# Patient Record
Sex: Female | Born: 1995 | Race: White | Hispanic: Yes | Marital: Single | Smoking: Never smoker
Health system: Southern US, Community
[De-identification: ages and names within clinical notes are randomized; demographics above are authoritative.]

## PROBLEM LIST (undated history)

## (undated) DIAGNOSIS — J45909 Unspecified asthma, uncomplicated: Secondary | ICD-10-CM

## (undated) DIAGNOSIS — R569 Unspecified convulsions: Secondary | ICD-10-CM

## (undated) DIAGNOSIS — F419 Anxiety disorder, unspecified: Secondary | ICD-10-CM

## (undated) DIAGNOSIS — D649 Anemia, unspecified: Secondary | ICD-10-CM

---
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
## 2013-04-18 ENCOUNTER — Encounter (HOSPITAL_BASED_OUTPATIENT_CLINIC_OR_DEPARTMENT_OTHER): Payer: Self-pay | Admitting: *Deleted

## 2013-04-18 ENCOUNTER — Emergency Department (HOSPITAL_BASED_OUTPATIENT_CLINIC_OR_DEPARTMENT_OTHER)
Admission: EM | Admit: 2013-04-18 | Discharge: 2013-04-18 | Disposition: A | Discharge status: Home / Self Care | Payer: Medicaid Other | Attending: Emergency Medicine | Admitting: Emergency Medicine

## 2013-04-18 DIAGNOSIS — Z8669 Personal history of other diseases of the nervous system and sense organs: Secondary | ICD-10-CM | POA: Insufficient documentation

## 2013-04-18 DIAGNOSIS — Z79899 Other long term (current) drug therapy: Secondary | ICD-10-CM | POA: Insufficient documentation

## 2013-04-18 DIAGNOSIS — L0291 Cutaneous abscess, unspecified: Secondary | ICD-10-CM

## 2013-04-18 DIAGNOSIS — L0501 Pilonidal cyst with abscess: Secondary | ICD-10-CM | POA: Insufficient documentation

## 2013-04-18 MED ORDER — OXYCODONE-ACETAMINOPHEN 5-325 MG PO TABS
2.0000 | ORAL_TABLET | Freq: Once | ORAL | Status: AC
  Administered 2013-04-18: 2 via ORAL
  Filled 2013-04-18 (×2): qty 2

## 2013-04-18 MED ORDER — CEPHALEXIN 500 MG PO CAPS: 500.0000 mg | ORAL_CAPSULE | Freq: Four times a day (QID) | ORAL | Status: DC

## 2013-04-18 MED ORDER — ONDANSETRON 8 MG PO TBDP
8.0000 mg | ORAL_TABLET | Freq: Once | ORAL | Status: AC
  Administered 2013-04-18: 8 mg via ORAL
  Filled 2013-04-18: qty 1

## 2013-04-18 NOTE — ED Notes (Signed)
Pt c/o abscess to buttocks x 2 weeks seen by PMD x 2 days placed on septra, pt states the abscess is worse

## 2013-04-18 NOTE — ED Provider Notes (Signed)
History    CSN: [id number] Arrival date & time 04/18/13  1348  First MD Initiated Contact with Patient 04/18/13 1358     Chief Complaint  Patient presents with  . Abscess   (Consider location/radiation/quality/duration/timing/severity/associated sxs/prior Treatment) HPI Comments: Patient is an 17 year old female presents today with 6 days of worsening pilonidal abscess. She seen 3 days earlier by her primary care physician who prescribed her Bactrim. At this time she was told it was just a skin infection. She has tried Aleve for the pain which has not helped the pain as a pressure and sharp pain. It is worse with palpation and sitting. She feels the pain while she walks. She has had one previous abscess that resolved spontaneously. She states she feels well otherwise. No fevers, chills, nausea, vomiting, abdominal pain, paresthesias.  Patient is a 17 y.o. female presenting with abscess. The history is provided by the patient and a parent. No language interpreter was used.  Abscess Location:  Ano-genital Ano-genital abscess location:  Gluteal cleft Abscess quality: fluctuance and painful   Abscess quality: not draining and no redness   Red streaking: no   Duration:  6 days Progression:  Worsening Pain details:    Quality:  Pressure   Severity:  Severe   Duration:  6 days   Timing:  Intermittent (worse with palpation and sitting)   Progression:  Worsening Chronicity:  New Context: not diabetes, not immunosuppression, not injected drug use, not insect bite/sting and not skin injury   Relieved by:  Nothing Exacerbated by: palpation, sitting. Ineffective treatments:  NSAIDs, oral antibiotics and warm water soaks Associated symptoms: no anorexia, no fatigue, no fever, no headaches, no nausea and no vomiting    Past Medical History  Diagnosis Date  . Seizures    Past Surgical History  Procedure Laterality Date  . Tonsillectomy     History reviewed. No pertinent family  history. History  Substance Use Topics  . Smoking status: Never Smoker   . Smokeless tobacco: Not on file  . Alcohol Use: No   OB History   Grav Para Term Preterm Abortions TAB SAB Ect Mult Living                 Review of Systems  Constitutional: Negative for fever, chills and fatigue.  Gastrointestinal: Negative for nausea, vomiting, abdominal pain and anorexia.  Skin: Positive for wound.  Neurological: Negative for headaches.  All other systems reviewed and are negative.    Allergies  Amoxicillin  Home Medications   Current Outpatient Rx  Name  Route  Sig  Dispense  Refill  . sulfamethoxazole-trimethoprim (BACTRIM DS) 800-160 MG per tablet   Oral   Take 1 tablet by mouth 2 (two) times daily.          BP 123/73  Temp(Src) 98.9 F (37.2 C) (Oral)  Resp 16  Ht 5\' 6"  (1.676 m)  Wt 180 lb (81.647 kg)  BMI 29.07 kg/m2  SpO2 100%  LMP 04/16/2013 Physical Exam  Nursing note and vitals reviewed. Constitutional: She is oriented to person, place, and time. She appears well-developed and well-nourished. No distress.  HENT:  Head: Normocephalic and atraumatic.  Right Ear: External ear normal.  Left Ear: External ear normal.  Nose: Nose normal.  Mouth/Throat: Oropharynx is clear and moist.  Eyes: Conjunctivae are normal.  Neck: Normal range of motion.  Cardiovascular: Normal rate, regular rhythm and normal heart sounds.   Pulmonary/Chest: Effort normal and breath sounds normal. No stridor. No  respiratory distress. She has no wheezes. She has no rales.  Abdominal: Soft. She exhibits no distension. There is no tenderness.  Musculoskeletal: Normal range of motion.  Neurological: She is alert and oriented to person, place, and time. She has normal strength.  Skin: Skin is warm and dry. She is not diaphoretic. No erythema.  3 cm area of fluctuance with mild surrounding induration at top of gluteal cleft. No rectal involvement.   Psychiatric: She has a normal mood and  affect. Her behavior is normal.    ED Course  Procedures (including critical care time)  INCISION AND DRAINAGE Performed by: Junious Silk Consent: Verbal consent obtained. Risks and benefits: risks, benefits and alternatives were discussed Type: abscess  Body area: gluteal cleft  Anesthesia: local infiltration  Incision was made with a scalpel.  Local anesthetic: lidocaine 2% 1% epinephrine  Anesthetic total: 3 ml  Complexity: complex Blunt dissection to break up loculations  Drainage: purulent  Drainage amount: copious  Patient tolerance: Patient tolerated the procedure well with no immediate complications.     Labs Reviewed  WOUND CULTURE   No results found. 1. Abscess     MDM  Patient with skin abscess amenable to incision and drainage.  Abscess was not large enough to warrant packing or drain,  wound recheck in 2 days. Encouraged home warm soaks and flushing.  Mild signs of cellulitis is surrounding skin.  Will d/c to home.  Wound culture sent.   Mora Bellman, PA-C 04/18/13 1524

## 2013-04-20 LAB — WOUND CULTURE
Culture: NO GROWTH
Gram Stain: NONE SEEN
Special Requests: NORMAL

## 2013-04-20 NOTE — ED Provider Notes (Signed)
Medical screening examination/treatment/procedure(s) were performed by non-physician practitioner and as supervising physician I was immediately available for consultation/collaboration.   Celso Granja, MD 04/20/13 0657 

## 2013-11-15 ENCOUNTER — Ambulatory Visit: Payer: Self-pay | Admitting: Family

## 2013-11-20 ENCOUNTER — Encounter: Payer: Self-pay | Admitting: Family

## 2013-11-20 ENCOUNTER — Ambulatory Visit (INDEPENDENT_AMBULATORY_CARE_PROVIDER_SITE_OTHER): Payer: 59 | Admitting: Family

## 2013-11-20 VITALS — BP 104/74 | HR 70 | Temp 97.4°F | Resp 16 | Ht 66.0 in | Wt 181.1 lb

## 2013-11-20 DIAGNOSIS — L659 Nonscarring hair loss, unspecified: Secondary | ICD-10-CM

## 2013-11-20 DIAGNOSIS — R55 Syncope and collapse: Secondary | ICD-10-CM

## 2013-11-20 DIAGNOSIS — L253 Unspecified contact dermatitis due to other chemical products: Secondary | ICD-10-CM

## 2013-11-20 DIAGNOSIS — Z23 Encounter for immunization: Secondary | ICD-10-CM

## 2013-11-20 DIAGNOSIS — R569 Unspecified convulsions: Secondary | ICD-10-CM

## 2013-11-20 LAB — CBC WITH DIFFERENTIAL/PLATELET
Basophils Absolute: 0 10*3/uL (ref 0.0–0.1)
Basophils Relative: 0 % (ref 0–1)
Eosinophils Absolute: 0.3 10*3/uL (ref 0.0–0.7)
Eosinophils Relative: 3 % (ref 0–5)
HCT: 38.1 % (ref 36.0–46.0)
Hemoglobin: 12.6 g/dL (ref 12.0–15.0)
Lymphocytes Relative: 33 % (ref 12–46)
Lymphs Abs: 2.9 10*3/uL (ref 0.7–4.0)
MCH: 29 pg (ref 26.0–34.0)
MCHC: 33.1 g/dL (ref 30.0–36.0)
MCV: 87.8 fL (ref 78.0–100.0)
Monocytes Absolute: 0.8 10*3/uL (ref 0.1–1.0)
Monocytes Relative: 9 % (ref 3–12)
Neutro Abs: 4.7 10*3/uL (ref 1.7–7.7)
Neutrophils Relative %: 55 % (ref 43–77)
Platelets: 237 10*3/uL (ref 150–400)
RBC: 4.34 MIL/uL (ref 3.87–5.11)
RDW: 13.9 % (ref 11.5–15.5)
WBC: 8.7 10*3/uL (ref 4.0–10.5)

## 2013-11-20 NOTE — Assessment & Plan Note (Signed)
Advised pt to use latex free gloves. Letter provided for her employer.

## 2013-11-20 NOTE — Patient Instructions (Signed)
Please complete lab work prior to leaving. Schedule fasting physical at the front desk. Welcome to [facility]! 

## 2013-11-20 NOTE — Assessment & Plan Note (Signed)
Sounds like vasovagal syncope. Has not occurred since summer 2014.  Monitor.

## 2013-11-20 NOTE — Assessment & Plan Note (Signed)
New, will obtain cbc to exclude anemia, TSH to exclude hypothyroid.

## 2013-11-20 NOTE — Assessment & Plan Note (Signed)
Well controlled.  Off meds.  Monitor.

## 2013-11-20 NOTE — Progress Notes (Signed)
Pre visit review using our clinic review tool, if applicable. No additional management support is needed unless otherwise documented below in the visit note. 

## 2013-11-20 NOTE — Progress Notes (Signed)
   Subjective:    Patient ID: Rebekah Carter, female    DOB: 03/22/1996, 18 y.o.   MRN: [id number]  HPI  Ms. Rebekah Carter is a 18 yr old female who presents today to establish care. She was followed by child [facility] center in [place].  1) Hair loss- reports that she first noticed in December.  Large hair loss with combing.  2) Seizure Disorder- started at age 89.  Was treated with seizure meds x 3 years.  She has not had a seizure since age 18.  She is not curre  3) Syncope- reports that this occurs with standing. Last episode was last summer.  No further episodes.   Review of Systems  HENT: Negative for hearing loss and rhinorrhea.   Eyes: Negative for visual disturbance.  Respiratory: Negative for cough.   Gastrointestinal: Negative for nausea, vomiting and diarrhea.  Genitourinary: Negative for dysuria, frequency and menstrual problem.  Musculoskeletal: Negative for arthralgias.  Skin:       Reports that she gets itchy rash from latex gloves at work.     Past Medical History  Diagnosis Date  . Seizures     History   Social History  . Marital Status: Single    Spouse Name: N/A    Number of Children: N/A  . Years of Education: N/A   Occupational History  . Not on file.   Social History Main Topics  . Smoking status: Never Smoker   . Smokeless tobacco: Never Used  . Alcohol Use: No  . Drug Use: No  . Sexual Activity: No   Other Topics Concern  . Not on file   Social History Narrative   Works as a [place]NA [place] at a [employer]   Lives with her parents   Only child   Enjoys shopping, sleep.          Past Surgical History  Procedure Laterality Date  . Tonsillectomy and adenoidectomy  2004    Family History  Problem Relation Age of Onset  . Diabetes Maternal Grandfather   . Hypertension Maternal Grandfather   . Diabetes Paternal Grandfather   . Hypertension Paternal Grandfather     Allergies  Allergen Reactions  . Amoxicillin     REACTION: rash,seizures     No current outpatient prescriptions on file prior to visit.   No current facility-administered medications on file prior to visit.    BP 104/74  Pulse 70  Temp(Src) 97.4 F (36.3 C) (Oral)  Resp 16  Ht 5\' 6"  (1.676 m)  Wt 181 lb 1.3 oz (82.137 kg)  BMI 29.24 kg/m2  SpO2 99%  LMP 11/01/2013       Objective:   Physical Exam  Constitutional: She is oriented to person, place, and time. She appears well-developed and well-nourished. No distress.  Cardiovascular: Normal rate and regular rhythm.   No murmur heard. Pulmonary/Chest: Effort normal and breath sounds normal. No respiratory distress. She has no wheezes. She has no rales. She exhibits no tenderness.  Musculoskeletal: She exhibits no edema.  Neurological: She is alert and oriented to person, place, and time.  Skin: Skin is warm and dry.  Hair appears thick, no visible alopecia or thinning noted  Psychiatric: She has a normal mood and affect. Her behavior is normal. Judgment and thought content normal.          Assessment & Plan:

## 2013-11-21 ENCOUNTER — Encounter: Payer: Self-pay | Admitting: Family

## 2013-11-21 LAB — TSH: TSH: 2.477 u[IU]/mL (ref 0.350–4.500)

## 2013-11-29 ENCOUNTER — Telehealth: Payer: Self-pay | Admitting: *Deleted

## 2013-11-29 MED ORDER — MOMETASONE FUROATE 0.1 % EX CREA: 1.0000 "application " | TOPICAL_CREAM | Freq: Every day | CUTANEOUS | Status: DC

## 2013-11-29 NOTE — Telephone Encounter (Signed)
Pt called back and left message that she has been using mometasone 0.1% cream and would like a refill of this.  Please advise.

## 2013-11-29 NOTE — Telephone Encounter (Signed)
Received call from pt's mother stating pt needs a refill of her eczema cream. She cannot remember the name of medication at present and will call us back with name and strength of cream.

## 2013-11-29 NOTE — Telephone Encounter (Signed)
rx sent

## 2013-11-29 NOTE — Telephone Encounter (Signed)
Notified pt's mom. 

## 2013-12-11 ENCOUNTER — Encounter: Payer: Self-pay | Admitting: Family

## 2013-12-11 ENCOUNTER — Ambulatory Visit (INDEPENDENT_AMBULATORY_CARE_PROVIDER_SITE_OTHER): Payer: 59 | Admitting: Family

## 2013-12-11 VITALS — BP 100/70 | HR 69 | Temp 97.6°F | Resp 16 | Ht 66.0 in | Wt 180.1 lb

## 2013-12-11 DIAGNOSIS — Z0000 Encounter for general adult medical examination without abnormal findings: Secondary | ICD-10-CM | POA: Insufficient documentation

## 2013-12-11 LAB — BASIC METABOLIC PANEL WITH GFR
BUN: 13 mg/dL (ref 6–23)
CO2: 27 mEq/L (ref 19–32)
Calcium: 9.7 mg/dL (ref 8.4–10.5)
Chloride: 103 mEq/L (ref 96–112)
Creat: 0.61 mg/dL (ref 0.50–1.10)
GFR, Est African American: >89 mL/min
GFR, Est Non African American: >89 mL/min
Glucose, Bld: 88 mg/dL (ref 70–99)
Potassium: 4 mEq/L (ref 3.5–5.3)
Sodium: 137 mEq/L (ref 135–145)

## 2013-12-11 LAB — HEPATIC FUNCTION PANEL
ALT: 11 U/L (ref 0–35)
AST: 15 U/L (ref 0–37)
Albumin: 4.6 g/dL (ref 3.5–5.2)
Alkaline Phosphatase: 99 U/L (ref 39–117)
Bilirubin, Direct: 0.1 mg/dL (ref 0.0–0.3)
Indirect Bilirubin: 0.3 mg/dL (ref 0.2–1.1)
Total Bilirubin: 0.4 mg/dL (ref 0.2–1.1)
Total Protein: 7.6 g/dL (ref 6.0–8.3)

## 2013-12-11 LAB — LIPID PANEL
Cholesterol: 132 mg/dL (ref 0–200)
HDL: 42 mg/dL (ref >39)
LDL Cholesterol: 79 mg/dL (ref 0–99)
Total CHOL/HDL Ratio: 3.1 Ratio
Triglycerides: 57 mg/dL (ref <150)
VLDL: 11 mg/dL (ref 0–40)

## 2013-12-11 NOTE — Progress Notes (Signed)
Pre visit review using our clinic review tool, if applicable. No additional management support is needed unless otherwise documented below in the visit note. 

## 2013-12-11 NOTE — Progress Notes (Signed)
Subjective:    Patient ID: Rebekah Carter, female    DOB: May 09, 1996, 18 y.o.   MRN: [id number]  HPI  Rebekah Carter is a 18 yr old female who presents today for cpx.  Immunizations: did not have gardasil.   Diet: reports healthy diet, lots of water Exercise:  Busy at work. No formal exercise She denies present or past history of sexual activity.     Review of Systems  Constitutional: Negative for unexpected weight change.  HENT: Negative for dental problem, hearing loss and postnasal drip.   Eyes: Negative for visual disturbance.  Respiratory: Negative for cough and shortness of breath.   Cardiovascular: Negative for chest pain.  Gastrointestinal: Negative for vomiting.  Genitourinary: Negative for menstrual problem.  Musculoskeletal: Negative for arthralgias and myalgias.  Skin: Negative for rash.  Neurological: Negative for headaches.  Hematological: Negative for adenopathy.  Psychiatric/Behavioral:       Denies depression/anxiety   Past Medical History  Diagnosis Date  . Seizures     History   Social History  . Marital Status: Single    Spouse Name: N/A    Number of Children: N/A  . Years of Education: N/A   Occupational History  . Not on file.   Social History Main Topics  . Smoking status: Never Smoker   . Smokeless tobacco: Never Used  . Alcohol Use: No  . Drug Use: No  . Sexual Activity: No   Other Topics Concern  . 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          Past Surgical History  Procedure Laterality Date  . Tonsillectomy and adenoidectomy  2004    Family History  Problem Relation Age of Onset  . Diabetes Maternal Grandfather   . Hypertension Maternal Grandfather   . Diabetes Paternal Grandfather   . Hypertension Paternal Grandfather     Allergies  Allergen Reactions  . Amoxicillin     REACTION: rash,seizures     Current Outpatient Prescriptions on File Prior to Visit  Medication Sig Dispense Refill  . mometasone (ELOCON) 0.1 % cream Apply 1 application topically daily. prn  45 g  1   No current facility-administered medications on file prior to visit.    BP 100/70  Pulse 69  Temp(Src) 97.6 F (36.4 C) (Oral)  Resp 16  Ht 5\' 6"  (1.676 m)  Wt 180 lb 1.3 oz (81.684 kg)  BMI 29.08 kg/m2  SpO2 99%  LMP 12/04/2013        Objective:   Physical Exam  Physical Exam  Constitutional: She is oriented to person, place, and time. She appears well-developed and well-nourished. No distress.  HENT:  Head: Normocephalic and atraumatic.  Right Ear: Tympanic membrane and ear canal normal.  Left Ear: Tympanic membrane and ear canal normal.  Mouth/Throat: Oropharynx is clear and moist.  Eyes: Pupils are equal, round, and reactive to light. No scleral icterus.  Neck: Normal range of motion. No thyromegaly present.  Cardiovascular: Normal rate and regular rhythm.   No murmur heard. Pulmonary/Chest: Effort normal and breath sounds normal. No respiratory distress. He has no wheezes. She has no rales. She exhibits no tenderness.  Abdominal: Soft. Bowel sounds are normal. He exhibits no distension and no mass. There is no tenderness. There is no rebound and no guarding.  Musculoskeletal: She exhibits no  edema.  Lymphadenopathy:    She has no cervical adenopathy.  Neurological: She is alert and oriented to person, place, and time. She has normal reflexes. She exhibits normal muscle tone. Coordination normal.  Skin: Skin is warm and dry.  Psychiatric: She has a normal mood and affect. Her behavior is normal. Judgment and thought content normal.  Breasts: Examined lying Right: Without masses, retractions, discharge or axillary adenopathy.  Left: Without masses, retractions, discharge or axillary adenopathy.  Pelvic: deferred          Assessment & Plan:         Assessment & Plan:

## 2013-12-11 NOTE — Assessment & Plan Note (Addendum)
Discussed regular exercise.  I have asked her to bring us a copy of her childhood immunization record.  She is interested in gardisil series, but she is unsure if she received it when she was younger. Discussed BSE.  Obtain fasting labs. Plan to start pap at 19.

## 2013-12-11 NOTE — Patient Instructions (Signed)
Please complete lab work prior to leaving. Please bring us a copy of your immunization record for review. Follow up in 1 year, sooner if problems or concerns.

## 2013-12-12 LAB — URINALYSIS, ROUTINE W REFLEX MICROSCOPIC
Bilirubin Urine: NEGATIVE
Glucose, UA: NEGATIVE mg/dL
Ketones, ur: NEGATIVE mg/dL
Leukocytes, UA: NEGATIVE
Nitrite: NEGATIVE
Protein, ur: NEGATIVE mg/dL
Specific Gravity, Urine: 1.02 (ref 1.005–1.030)
Urobilinogen, UA: 0.2 mg/dL (ref 0.0–1.0)
pH: 5.5 (ref 5.0–8.0)

## 2013-12-12 LAB — URINALYSIS, MICROSCOPIC ONLY
Bacteria, UA: NONE SEEN
Casts: NONE SEEN
Crystals: NONE SEEN
Squamous Epithelial / LPF: NONE SEEN

## 2013-12-14 ENCOUNTER — Encounter: Payer: Self-pay | Admitting: Family

## 2014-02-10 ENCOUNTER — Telehealth: Payer: Self-pay | Admitting: Family

## 2014-02-10 NOTE — Telephone Encounter (Signed)
Requesting refill on cetirizine 10 mg tab 

## 2014-02-11 MED ORDER — CETIRIZINE HCL 10 MG PO TABS: 10.0000 mg | ORAL_TABLET | Freq: Every day | ORAL | Status: DC

## 2014-02-11 NOTE — Telephone Encounter (Signed)
rx sent

## 2014-02-11 NOTE — Telephone Encounter (Signed)
I do not see that cetirizine was ever on her medication list. Is it ok to send Rx?

## 2014-02-12 ENCOUNTER — Encounter: Payer: Self-pay | Admitting: Family

## 2014-02-12 ENCOUNTER — Ambulatory Visit (INDEPENDENT_AMBULATORY_CARE_PROVIDER_SITE_OTHER): Payer: 59 | Admitting: Family

## 2014-02-12 VITALS — BP 118/76 | HR 67 | Temp 98.2°F | Resp 16 | Ht 66.0 in | Wt 181.0 lb

## 2014-02-12 DIAGNOSIS — M519 Unspecified thoracic, thoracolumbar and lumbosacral intervertebral disc disorder: Secondary | ICD-10-CM | POA: Insufficient documentation

## 2014-02-12 DIAGNOSIS — M5430 Sciatica, unspecified side: Secondary | ICD-10-CM

## 2014-02-12 DIAGNOSIS — L819 Disorder of pigmentation, unspecified: Secondary | ICD-10-CM | POA: Insufficient documentation

## 2014-02-12 MED ORDER — METHYLPREDNISOLONE 4 MG PO KIT: PACK | ORAL | Status: DC

## 2014-02-12 MED ORDER — CYCLOBENZAPRINE HCL 5 MG PO TABS: 5.0000 mg | ORAL_TABLET | Freq: Every evening | ORAL | Status: DC | PRN

## 2014-02-12 NOTE — Progress Notes (Signed)
Pre visit review using our clinic review tool, if applicable. No additional management support is needed unless otherwise documented below in the visit note. 

## 2014-02-12 NOTE — Progress Notes (Signed)
   Subjective:    Patient ID: Rebekah Carter, female    DOB: 08/05/1996, 18 y.o.   MRN: [id number]  HPI  Back Pain Pt reports constant lower right side back pain x 4 weeks. Reports that she pulled her back at work. She has been using aleve with some improvement.  Pain is worse with walking, radiates down the right buttock with walking.  She denies previous similar symptoms.  Skin Discoloration-Pt notes enlarging white patches on both hands. Has latex allergy and notes that she is now using latexfree gloves but hands still break out.    Review of Systems    see HPI  Past Medical History  Diagnosis Date  . Seizures     History   Social History  . Marital Status: Single    Spouse Name: N/A    Number of Children: N/A  . Years of Education: N/A   Occupational History  . Not on file.   Social History Main Topics  . Smoking status: Never Smoker   . Smokeless tobacco: Never Used  . Alcohol Use: No  . Drug Use: No  . Sexual Activity: No   Other Topics Concern  . Not on file   Social History Narrative   [place] [employer]   Lives with her parents   Only child   Enjoys shopping, sleep.          Past Surgical History  Procedure Laterality Date  . Tonsillectomy and adenoidectomy  2004    Family History  Problem Relation Age of Onset  . Diabetes Maternal Grandfather   . Hypertension Maternal Grandfather   . Diabetes Paternal Grandfather   . Hypertension Paternal Grandfather     Allergies  Allergen Reactions  . Amoxicillin     REACTION: rash,seizures    Current Outpatient Prescriptions on File Prior to Visit  Medication Sig Dispense Refill  . cetirizine (ZYRTEC) 10 MG tablet Take 1 tablet (10 mg total) by mouth daily.  30 tablet  5  . mometasone (ELOCON) 0.1 % cream Apply 1 application topically daily. prn  45 g  1   No current facility-administered medications on file prior to visit.    BP 118/76  Pulse 67  Temp(Src) 98.2 F (36.8 C)  (Oral)  Resp 16  Ht 5\' 6"  (1.676 m)  Wt 181 lb 0.6 oz (82.119 kg)  BMI 29.23 kg/m2  SpO2 98%  LMP 02/02/2014    Objective:   Physical Exam  Constitutional: She is oriented to person, place, and time. She appears well-developed and well-nourished. No distress.  HENT:  Head: Normocephalic and atraumatic.  Cardiovascular: Normal rate and regular rhythm.   No murmur heard. Pulmonary/Chest: Effort normal and breath sounds normal. No respiratory distress. She has no wheezes. She has no rales. She exhibits no tenderness.  Musculoskeletal:       Cervical back: She exhibits no pain.       Thoracic back: She exhibits no pain.  Mild right low back tenderness to palpation  Neurological: She is alert and oriented to person, place, and time.  Psychiatric: She has a normal mood and affect. Her behavior is normal. Judgment and thought content normal.  skin: hypopigmented patches noted on bilateral dorsal hands and one patch right lower cheek        Assessment & Plan:

## 2014-02-12 NOTE — Assessment & Plan Note (Signed)
?   Early vitiligo. Refer to dermatology for further evaluation.

## 2014-02-12 NOTE — Assessment & Plan Note (Signed)
No improvements with NSAIDS.  Rx with medrol dose pak and prn flexeril.

## 2014-02-12 NOTE — Patient Instructions (Signed)
Start medrol dose pak and flexeril at bedtime. You will be contacted about your referral to dermatology. Call if symptoms worsen, or if not resolved in 1 month.   Sciatica Sciatica is pain, weakness, numbness, or tingling along the path of the sciatic nerve. The nerve starts in the lower back and runs down the back of each leg. The nerve controls the muscles in the lower leg and in the back of the knee, while also providing sensation to the back of the thigh, lower leg, and the sole of your foot. Sciatica is a symptom of another medical condition. For instance, nerve damage or certain conditions, such as a herniated disk or bone spur on the spine, pinch or put pressure on the sciatic nerve. This causes the pain, weakness, or other sensations normally associated with sciatica. Generally, sciatica only affects one side of the body. CAUSES   Herniated or slipped disc.  Degenerative disk disease.  A pain disorder involving the narrow muscle in the buttocks (piriformis syndrome).  Pelvic injury or fracture.  Pregnancy.  Tumor (rare). SYMPTOMS  Symptoms can vary from mild to very severe. The symptoms usually travel from the low back to the buttocks and down the back of the leg. Symptoms can include:  Mild tingling or dull aches in the lower back, leg, or hip.  Numbness in the back of the calf or sole of the foot.  Burning sensations in the lower back, leg, or hip.  Sharp pains in the lower back, leg, or hip.  Leg weakness.  Severe back pain inhibiting movement. These symptoms may get worse with coughing, sneezing, laughing, or prolonged sitting or standing. Also, being overweight may worsen symptoms. DIAGNOSIS  Your caregiver will perform a physical exam to look for common symptoms of sciatica. He or she may ask you to do certain movements or activities that would trigger sciatic nerve pain. Other tests may be performed to find the cause of the sciatica. These may include:  Blood  tests.  X-rays.  Imaging tests, such as an MRI or CT scan. TREATMENT  Treatment is directed at the cause of the sciatic pain. Sometimes, treatment is not necessary and the pain and discomfort goes away on its own. If treatment is needed, your caregiver may suggest:  Over-the-counter medicines to relieve pain.  Prescription medicines, such as anti-inflammatory medicine, muscle relaxants, or narcotics.  Applying heat or ice to the painful area.  Steroid injections to lessen pain, irritation, and inflammation around the nerve.  Reducing activity during periods of pain.  Exercising and stretching to strengthen your abdomen and improve flexibility of your spine. Your caregiver may suggest losing weight if the extra weight makes the back pain worse.  Physical therapy.  Surgery to eliminate what is pressing or pinching the nerve, such as a bone spur or part of a herniated disk. HOME CARE INSTRUCTIONS   Only take over-the-counter or prescription medicines for pain or discomfort as directed by your caregiver.  Apply ice to the affected area for 20 minutes, 3 4 times a day for the first 48 72 hours. Then try heat in the same way.  Exercise, stretch, or perform your usual activities if these do not aggravate your pain.  Attend physical therapy sessions as directed by your caregiver.  Keep all follow-up appointments as directed by your caregiver.  Do not wear high heels or shoes that do not provide proper support.  Check your mattress to see if it is too soft. A firm mattress may  lessen your pain and discomfort. SEEK IMMEDIATE MEDICAL CARE IF:   You lose control of your bowel or bladder (incontinence).  You have increasing weakness in the lower back, pelvis, buttocks, or legs.  You have redness or swelling of your back.  You have a burning sensation when you urinate.  You have pain that gets worse when you lie down or awakens you at night.  Your pain is worse than you have  experienced in the past.  Your pain is lasting longer than 4 weeks.  You are suddenly losing weight without reason. MAKE SURE YOU:  Understand these instructions.  Will watch your condition.  Will get help right away if you are not doing well or get worse. Document Released: 09/20/2001 Document Revised: 03/27/2012 Document Reviewed: 02/05/2012 [facility] Patient Information 2014 [place] [place].

## 2014-05-23 ENCOUNTER — Telehealth: Payer: Self-pay | Admitting: Family

## 2014-05-23 ENCOUNTER — Ambulatory Visit (INDEPENDENT_AMBULATORY_CARE_PROVIDER_SITE_OTHER): Payer: 59 | Admitting: Family

## 2014-05-23 ENCOUNTER — Encounter: Payer: Self-pay | Admitting: Family

## 2014-05-23 VITALS — BP 114/70 | HR 66 | Temp 97.8°F | Resp 14 | Ht 66.0 in | Wt 184.0 lb

## 2014-05-23 DIAGNOSIS — R21 Rash and other nonspecific skin eruption: Secondary | ICD-10-CM | POA: Insufficient documentation

## 2014-05-23 MED ORDER — MOMETASONE FUROATE 0.1 % EX CREA: 1.0000 "application " | TOPICAL_CREAM | Freq: Every day | CUTANEOUS | Status: DC

## 2014-05-23 MED ORDER — PERMETHRIN 5 % EX CREA: TOPICAL_CREAM | CUTANEOUS | Status: DC

## 2014-05-23 NOTE — Progress Notes (Signed)
   Subjective:    Patient ID: Rebekah Carter, female    DOB: 12/26/1995, 18 y.o.   MRN: [id number]  HPI  Rebekah Carter is a [age] year old female who presents today with chief complaint of rash. Not improving with elocon. Reports that rash started 1 month ago.  Rash is pruritic. It is located on ankles. Arms/hands and thighs. Reports + exposure to scabies at her job.  She works in a SNF.  Review of Systems See HPI  Past Medical History  Diagnosis Date  . Seizures     History   Social History  . Marital Status: Single    Spouse Name: N/A    Number of Children: N/A  . Years of Education: N/A   Occupational History  . Not on file.   Social History Main Topics  . Smoking status: Never Smoker   . Smokeless tobacco: Never Used  . Alcohol Use: No  . Drug Use: No  . Sexual Activity: No   Other Topics Concern  . Not on file   Social History Narrative   Works as a [place]NA [place] at a [employer]   Lives with her parents   Only child   Enjoys shopping, sleep.          Past Surgical History  Procedure Laterality Date  . Tonsillectomy and adenoidectomy  2004    Family History  Problem Relation Age of Onset  . Diabetes Maternal Grandfather   . Hypertension Maternal Grandfather   . Diabetes Paternal Grandfather   . Hypertension Paternal Grandfather     Allergies  Allergen Reactions  . Amoxicillin     REACTION: rash,seizures    Current Outpatient Prescriptions on File Prior to Visit  Medication Sig Dispense Refill  . cetirizine (ZYRTEC) 10 MG tablet Take 1 tablet (10 mg total) by mouth daily.  30 tablet  5  . mometasone (ELOCON) 0.1 % cream Apply 1 application topically daily. prn  45 g  1   No current facility-administered medications on file prior to visit.    BP 114/70  Pulse 66  Temp(Src) 97.8 F (36.6 C)  Resp 14  Ht 5\' 6"  (1.676 m)  Wt 184 lb (83.462 kg)  BMI 29.71 kg/m2  SpO2 99%  LMP 05/04/2014       Objective:   Physical Exam  Constitutional:  She appears well-developed and well-nourished.  Skin: Skin is warm and dry.  + raised small pustular rash noted on ankles, wrists, hands          Assessment & Plan:

## 2014-05-23 NOTE — Telephone Encounter (Signed)
Received prior auth for permethrin and mometasone, forward to nurse

## 2014-05-23 NOTE — Telephone Encounter (Signed)
Called PA # from pharmacy [phone] [facility] Our system shows medicaid as inactive. [facility]  Notified pharmacy and they tell me that they have now corrected this and pt has already picked up Rx.

## 2014-05-23 NOTE — Assessment & Plan Note (Signed)
Possible scabies. Will rx with permethrin cream today, repeat in 1 week. Pt instructed to wash all bedding.  If symptoms not improved in 1 week, I have advised her to contact her dermatologist for follow up. She verbalizes understanding.

## 2014-05-23 NOTE — Progress Notes (Signed)
Pre visit review using our clinic review tool, if applicable. No additional management support is needed unless otherwise documented below in the visit note. 

## 2014-05-23 NOTE — Patient Instructions (Signed)

## 2014-07-01 ENCOUNTER — Ambulatory Visit (INDEPENDENT_AMBULATORY_CARE_PROVIDER_SITE_OTHER): Payer: 59 | Admitting: Family

## 2014-07-01 ENCOUNTER — Encounter: Payer: Self-pay | Admitting: Family

## 2014-07-01 VITALS — BP 126/61 | HR 81 | Temp 98.5°F | Resp 16 | Ht 66.0 in | Wt 187.0 lb

## 2014-07-01 DIAGNOSIS — M5431 Sciatica, right side: Secondary | ICD-10-CM

## 2014-07-01 DIAGNOSIS — Z3202 Encounter for pregnancy test, result negative: Secondary | ICD-10-CM

## 2014-07-01 DIAGNOSIS — M5430 Sciatica, unspecified side: Secondary | ICD-10-CM

## 2014-07-01 LAB — POCT URINE PREGNANCY: Preg Test, Ur: NEGATIVE

## 2014-07-01 MED ORDER — METHYLPREDNISOLONE 4 MG PO KIT: PACK | ORAL | Status: DC

## 2014-07-01 MED ORDER — CYCLOBENZAPRINE HCL 5 MG PO TABS: 5.0000 mg | ORAL_TABLET | Freq: Three times a day (TID) | ORAL | Status: DC | PRN

## 2014-07-01 NOTE — Progress Notes (Signed)
Pre visit review using our clinic review tool, if applicable. No additional management support is needed unless otherwise documented below in the visit note/SLS  

## 2014-07-01 NOTE — Assessment & Plan Note (Signed)
Persistent pain for three months with decreased strength in right leg especially with dorsiflexion.  MRI is needed to further evaluate.

## 2014-07-01 NOTE — Progress Notes (Signed)
Subjective:    Patient ID: Rebekah Carter, female    DOB: 1996/07/17, 18 y.o.   MRN: [id number]  HPI  Back pain- Rebekah Carter is a 18 year old female who presents today with complaints of back pain and right leg pain. Pain started three months ago and has hurt every day for three months. She describes the pain as sharp and stabbing in both the lower back and leg.  She rates 10/10.  Nothing makes the pain better,but has tried taking 2 Aleve and takes the pain to 8/10 but the pain quickly returns.  Bending over, walking and sitting for a long period of time (>30 min) make the pain worse.  She cannot sit in church for longer than 30 minutes due to the pain.  She feels like she cannot take a full step when walking because she feels a pulling sensation in the right leg. 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     Review of Systems  Constitutional: Negative for fever, chills and fatigue.  HENT: Negative.   Eyes: Negative.   Respiratory: Negative for cough and shortness of breath.   Cardiovascular: Negative for chest pain, palpitations and leg swelling.  Gastrointestinal: Negative.   Genitourinary: Negative.   Skin: Negative for color change, pallor and rash.  Neurological: Negative for dizziness, tremors, weakness and headaches.   Past Medical History  Diagnosis Date  . Seizures     History   Social History  . Marital Status: Single    Spouse Name: N/A    Number of Children: N/A  . Years of Education: N/A   Occupational History  . Not on file.   Social History Main Topics  . Smoking status: Never Smoker   . Smokeless tobacco: Never Used  . Alcohol Use: No  . Drug Use: No  . Sexual Activity: No   Other Topics Concern  . 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          Past Surgical History  Procedure Laterality  Date  . Tonsillectomy and adenoidectomy  2004    Family History  Problem Relation Age of Onset  . Diabetes Maternal Grandfather   . Hypertension Maternal Grandfather   . Diabetes Paternal Grandfather   . Hypertension Paternal Grandfather     Allergies  Allergen Reactions  . Amoxicillin     REACTION: rash,seizures    Current Outpatient Prescriptions on File Prior to Visit  Medication Sig Dispense Refill  . cetirizine (ZYRTEC) 10 MG tablet Take 1 tablet (10 mg total) by mouth daily.  30 tablet  5  . mometasone (ELOCON) 0.1 % cream Apply 1 application topically daily. prn  45 g  1  . permethrin (ACTICIN) 5 % cream Massage permethrin cream thoroughly into the skin from the neck to the soles of the feet, including areas under the fingernails and toenails.  Leave on for 14 hours then rinse. Repeat in 1 week  60 g  0   No current facility-administered medications on file prior to visit.    BP 126/61  Pulse 81  Temp(Src) 98.5 F (36.9 C) (Oral)  Resp 16  Ht  (1.676 m)  Wt 187 lb (84.823 kg)  BMI 30.20 kg/m2  SpO2 98%  LMP 06/05/2014  Objective:   Physical Exam  Constitutional: She is oriented to person, place, and time. She appears well-developed and well-nourished.  HENT:  Head: Normocephalic.  Right Ear: External ear normal.  Left Ear: External ear normal.  Eyes: Pupils are equal, round, and reactive to light.  Neck: Normal range of motion.  Cardiovascular: Normal rate, regular rhythm, normal heart sounds and intact distal pulses.   No murmur heard. Pulmonary/Chest: Effort normal and breath sounds normal.  Musculoskeletal:       Lumbar back: She exhibits decreased range of motion and pain. She exhibits no tenderness, no swelling, no edema, no deformity and no laceration.       Right lower leg: She exhibits no tenderness, no swelling and no edema.       Right foot: She exhibits no tenderness and no swelling.  Decreased strength with dorsiflexion.    Neurological: She is alert and oriented to person, place, and time.  Skin: Skin is warm and dry. No rash noted.          Assessment & Plan:  I have personally seen and examined patient and agree with Suzzette Righter NP student's assessment and plan. Rx with medrol dose pak, flexeril, obtain mri of the LS spine.

## 2014-07-01 NOTE — Patient Instructions (Signed)
Please proceed to the lab for urine test. You will be contacted about your MRI. Start medrol dose pak. You may use flexeril as needed (may cause drowsiness) Follow up in 1 month, call sooner if symptoms worsen or do not improve.

## 2014-07-05 ENCOUNTER — Telehealth: Payer: Self-pay | Admitting: Family

## 2014-07-05 ENCOUNTER — Ambulatory Visit (HOSPITAL_BASED_OUTPATIENT_CLINIC_OR_DEPARTMENT_OTHER)
Admission: RE | Admit: 2014-07-05 | Discharge: 2014-07-05 | Disposition: A | Discharge status: Home / Self Care | Payer: 59 | Source: Ambulatory Visit | Attending: Family | Admitting: Family

## 2014-07-05 DIAGNOSIS — M5430 Sciatica, unspecified side: Secondary | ICD-10-CM | POA: Diagnosis not present

## 2014-07-05 DIAGNOSIS — M5431 Sciatica, right side: Secondary | ICD-10-CM

## 2014-07-05 DIAGNOSIS — R209 Unspecified disturbances of skin sensation: Secondary | ICD-10-CM | POA: Insufficient documentation

## 2014-07-05 NOTE — Telephone Encounter (Signed)
Please contact pt and let her know that MRI shows 2 bulging discs in her lower back.  I would like her to complete medrol dose pak, will also refer to neurosurgeon for further evaluation.

## 2014-07-07 NOTE — Telephone Encounter (Signed)
Notified pt and she voices understanding. 

## 2014-07-07 NOTE — Telephone Encounter (Signed)
Left message for pt to return my call.

## 2014-07-07 NOTE — Telephone Encounter (Signed)
Pt returning your call regarding her results.

## 2014-07-23 ENCOUNTER — Telehealth: Payer: Self-pay | Admitting: Family

## 2014-07-23 MED ORDER — MELOXICAM 7.5 MG PO TABS: 7.5000 mg | ORAL_TABLET | Freq: Every day | ORAL | Status: DC

## 2014-07-23 NOTE — Telephone Encounter (Signed)
I would not recommend increasing flexeril, but I have sent rx for meloxicam to her pharmacy which should help her.

## 2014-07-23 NOTE — Telephone Encounter (Signed)
Caller name: Dreanna Relation to pt: self  Call back number: [phone] Pharmacy:  Reason for call:   Patient states that the muscle spasms medication is not helping and would like the medication increased until she has her shot 08/05/14

## 2014-07-23 NOTE — Telephone Encounter (Signed)
Notified pt and she voices understanding. 

## 2014-08-01 ENCOUNTER — Ambulatory Visit (INDEPENDENT_AMBULATORY_CARE_PROVIDER_SITE_OTHER): Payer: 59 | Admitting: Family

## 2014-08-01 ENCOUNTER — Encounter: Payer: Self-pay | Admitting: Family

## 2014-08-01 VITALS — BP 110/70 | HR 93 | Temp 97.4°F | Resp 16 | Ht 66.0 in | Wt 191.0 lb

## 2014-08-01 DIAGNOSIS — M2170 Unequal limb length (acquired), unspecified site: Secondary | ICD-10-CM | POA: Insufficient documentation

## 2014-08-01 DIAGNOSIS — M519 Unspecified thoracic, thoracolumbar and lumbosacral intervertebral disc disorder: Secondary | ICD-10-CM

## 2014-08-01 MED ORDER — MELOXICAM 7.5 MG PO TABS: 7.5000 mg | ORAL_TABLET | Freq: Every day | ORAL | Status: DC

## 2014-08-01 NOTE — Assessment & Plan Note (Signed)
Will refer to podiatry for orthodic to help even out leg length.

## 2014-08-01 NOTE — Progress Notes (Signed)
Subjective:    Patient ID: Rebekah Carter, female    DOB: 12/13/1995, 18 y.o.   MRN: [id number]  HPI  Ms. Rebekah Carter is a 18 yr old female who presents today for follow up of her low back pain. She was last seen on 9/22 and was given medrol dose pak, flexeril and an MRI of the lumbar spine was obtained.   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 At  both levels, there is probable encroachment on the nerve roots  within the lateral recesses, right greater than left.  After review of these results a referral was made to neurosurgery.  I have not yet received consult note, but per pt and mother Neurosurgery has recommended surgery. They are hesitant to proceed with surgery at this time. They wish to try non-invasive methods first.  She was referred to chiropractic for laser therapy to help stimulate healing by neurosurgeon.  They plan to wait 2 months and if no improvement, will consider surgery.  Patient was found to have uneven leg length which is contributing to her back issues.   Review of Systems See HPI  Past Medical History  Diagnosis Date  . Seizures     History   Social History  . Marital Status: Single    Spouse Name: N/A    Number of Children: N/A  . Years of Education: N/A   Occupational History  . Not on file.   Social History Main Topics  . Smoking status: Never Smoker   . Smokeless tobacco: Never Used  . Alcohol Use: No  . Drug Use: No  . Sexual Activity: No   Other Topics Concern  . Not on file   Social History Narrative   Works as a [place]NA [place] at a [employer]   Lives with her parents   Only child   Enjoys shopping, sleep.          Past Surgical History  Procedure Laterality Date  . Tonsillectomy and adenoidectomy  2004    Family History  Problem Relation Age of Onset  . Diabetes Maternal Grandfather   . Hypertension Maternal Grandfather   . Diabetes  Paternal Grandfather   . Hypertension Paternal Grandfather     Allergies  Allergen Reactions  . Amoxicillin     REACTION: rash,seizures    Current Outpatient Prescriptions on File Prior to Visit  Medication Sig Dispense Refill  . cetirizine (ZYRTEC) 10 MG tablet Take 1 tablet (10 mg total) by mouth daily.  30 tablet  5  . cyclobenzaprine (FLEXERIL) 5 MG tablet Take 1 tablet (5 mg total) by mouth 3 (three) times daily as needed for muscle spasms.  20 tablet  0  . methylPREDNISolone (MEDROL DOSEPAK) 4 MG tablet follow package directions  21 tablet  0  . mometasone (ELOCON) 0.1 % cream Apply 1 application topically daily. prn  45 g  1  . permethrin (ACTICIN) 5 % cream Massage permethrin cream thoroughly into the skin from the neck to the soles of the feet, including areas under the fingernails and toenails.  Leave on for 14 hours then rinse. Repeat in 1 week  60 g  0   No current facility-administered medications on file prior to visit.    BP 110/70  Pulse 93  Temp(Src) 97.4 F (36.3 C) (Oral)  Resp 16  Ht 5\' 6"  (1.676 m)  Wt 191 lb (86.637 kg)  BMI 30.84  kg/m2  SpO2 97%  LMP 07/07/2014       Objective:   Physical Exam  Constitutional: She appears well-developed and well-nourished. No distress.  HENT:  Head: Normocephalic and atraumatic.  Cardiovascular: Normal rate and regular rhythm.   No murmur heard. Pulmonary/Chest: Effort normal and breath sounds normal. No respiratory distress. She has no wheezes. She has no rales. She exhibits no tenderness.  Musculoskeletal:       Cervical back: She exhibits no tenderness.       Thoracic back: She exhibits no tenderness.       Lumbar back: She exhibits no tenderness.  Neurological:  RLE strength is 4-5/5 LLE strength is 5/5  Skin: Skin is warm and dry.  Psychiatric: She has a normal mood and affect. Her behavior is normal. Judgment and thought content normal.          Assessment & Plan:

## 2014-08-01 NOTE — Assessment & Plan Note (Signed)
Patient is following with neurosurgeon and chiropracter.

## 2014-08-01 NOTE — Patient Instructions (Signed)
Continue meloxicam as needed. Follow up in 3 months.

## 2014-08-01 NOTE — Progress Notes (Signed)
Pre visit review using our clinic review tool, if applicable. No additional management support is needed unless otherwise documented below in the visit note. 

## 2014-08-26 ENCOUNTER — Telehealth: Payer: Self-pay | Admitting: *Deleted

## 2014-08-26 DIAGNOSIS — M519 Unspecified thoracic, thoracolumbar and lumbosacral intervertebral disc disorder: Secondary | ICD-10-CM

## 2014-08-26 NOTE — Telephone Encounter (Signed)
Spoke with pt and mom, pt would like to continue chiropractor visits but needs referral to continue with Genene ChurnAaron Williams, [place].  Please place referral.

## 2014-08-29 ENCOUNTER — Other Ambulatory Visit: Payer: Self-pay | Admitting: Family

## 2014-08-29 NOTE — Telephone Encounter (Signed)
Spoke with pt. She states she still has rash on stomach that has not completely resolved. Refill sent.

## 2014-08-29 NOTE — Telephone Encounter (Signed)
Is she having recurrent sx's?  If so ok to send refill.

## 2014-10-31 ENCOUNTER — Ambulatory Visit: Payer: Self-pay | Admitting: Family

## 2015-01-23 ENCOUNTER — Telehealth: Payer: Self-pay | Admitting: Family

## 2015-01-23 NOTE — Telephone Encounter (Signed)
Pre Visit letter sent  °

## 2015-01-26 ENCOUNTER — Ambulatory Visit (INDEPENDENT_AMBULATORY_CARE_PROVIDER_SITE_OTHER): Payer: No Typology Code available for payment source | Admitting: Family

## 2015-01-26 ENCOUNTER — Encounter: Payer: Self-pay | Admitting: Family

## 2015-01-26 VITALS — BP 122/70 | HR 64 | Temp 97.6°F | Resp 16 | Ht 66.0 in | Wt 191.6 lb

## 2015-01-26 DIAGNOSIS — R5382 Chronic fatigue, unspecified: Secondary | ICD-10-CM

## 2015-01-26 DIAGNOSIS — Z23 Encounter for immunization: Secondary | ICD-10-CM | POA: Diagnosis not present

## 2015-01-26 DIAGNOSIS — R5383 Other fatigue: Secondary | ICD-10-CM | POA: Insufficient documentation

## 2015-01-26 LAB — CBC WITH DIFFERENTIAL/PLATELET
Basophils Absolute: 0 10*3/uL (ref 0.0–0.1)
Basophils Relative: 0.5 % (ref 0.0–3.0)
Eosinophils Absolute: 0.5 10*3/uL (ref 0.0–0.7)
Eosinophils Relative: 5.1 % — ABNORMAL HIGH (ref 0.0–5.0)
HCT: 37.3 % (ref 36.0–46.0)
Hemoglobin: 12.5 g/dL (ref 12.0–15.0)
Lymphocytes Relative: 28.8 % (ref 12.0–46.0)
Lymphs Abs: 2.6 10*3/uL (ref 0.7–4.0)
MCHC: 33.5 g/dL (ref 30.0–36.0)
MCV: 87.7 fl (ref 78.0–100.0)
Monocytes Absolute: 0.6 10*3/uL (ref 0.1–1.0)
Monocytes Relative: 6.8 % (ref 3.0–12.0)
Neutro Abs: 5.3 10*3/uL (ref 1.4–7.7)
Neutrophils Relative %: 58.8 % (ref 43.0–77.0)
Platelets: 268 10*3/uL (ref 150.0–400.0)
RBC: 4.25 Mil/uL (ref 3.87–5.11)
RDW: 13.3 % (ref 11.5–14.6)
WBC: 9.1 10*3/uL (ref 4.5–10.5)

## 2015-01-26 LAB — TSH: TSH: 1.75 u[IU]/mL (ref 0.35–5.50)

## 2015-01-26 NOTE — Addendum Note (Signed)
Addended by: Mervin KungFERGERSON, Leimomi Zervas A on: 01/26/2015 12:02 PM   Modules accepted: Orders

## 2015-01-26 NOTE — Assessment & Plan Note (Signed)
Will obtain cbc to rule out anemia, TSH to rule out hypothyroid. EBV panel to assess for mono. Other considerations are depression and OSA, consider further work up next visit if symptoms persist and lab work unrevealing.

## 2015-01-26 NOTE — Progress Notes (Signed)
Pre visit review using our clinic review tool, if applicable. No additional management support is needed unless otherwise documented below in the visit note. 

## 2015-01-26 NOTE — Patient Instructions (Signed)
Please complete lab work prior to leaving. Call if fatigue worsens or does not improve. Follow up in 1 month.

## 2015-01-26 NOTE — Progress Notes (Signed)
   Subjective:    Patient ID: Rebekah Carter, female    DOB: 03/18/1996, 18 y.o.   MRN: [id number]  HPI  Rebekah Carter is a 19 yr old female who presents today with chief complaint of fatigue. Reports that fatigue has worsened since she started at the gym.  She has been doing treadmill, bike, stairs. Reports no current back pain. She is not currently working.  She denies heavy menses.  Last period was longer than usual- lasted 7 days. Denies fevers.  Reports that she is sleeping >8 hours a night but still having trouble getting up in the AM.  Denies myalgia, arthralgia. She reports + snoring.  Started working out this month.  Mood is "always irritable."  Going to the GYM relieves her stress but still feels sleepy.    Review of Systems See HPI  Past Medical History  Diagnosis Date  . Seizures     History   Social History  . Marital Status: Single    Spouse Name: N/A  . Number of Children: N/A  . Years of Education: N/A   Occupational History  . Not on file.   Social History Main Topics  . Smoking status: Never Smoker   . Smokeless tobacco: Never Used  . Alcohol Use: No  . Drug Use: No  . Sexual Activity: No   Other Topics Concern  . Not on file   Social History Narrative   [place] at [place] [employer]   Lives with her parents   Only child   Enjoys shopping, sleep.          Past Surgical History  Procedure Laterality Date  . Tonsillectomy and adenoidectomy  2004    Family History  Problem Relation Age of Onset  . Diabetes Maternal Grandfather   . Hypertension Maternal Grandfather   . Diabetes Paternal Grandfather   . Hypertension Paternal Grandfather     Allergies  Allergen Reactions  . Amoxicillin     REACTION: rash,seizures    Current Outpatient Prescriptions on File Prior to Visit  Medication Sig Dispense Refill  . cetirizine (ZYRTEC) 10 MG tablet Take 1 tablet (10 mg total) by mouth daily. 30 tablet 5  . mometasone (ELOCON) 0.1 % cream  Apply 1 application topically daily. prn 45 g 1   No current facility-administered medications on file prior to visit.    BP 122/70 mmHg  Pulse 64  Temp(Src) 97.6 F (36.4 C) (Oral)  Resp 16  Ht 5\' 6"  (1.676 m)  Wt 191 lb 9.6 oz (86.909 kg)  BMI 30.94 kg/m2  SpO2 99%  LMP 01/09/2015       Objective:   Physical Exam  Constitutional: She is oriented to person, place, and time. She appears well-developed and well-nourished.  Neck: No thyromegaly present.  Cardiovascular: Normal rate, regular rhythm and normal heart sounds.   No murmur heard. Pulmonary/Chest: Effort normal and breath sounds normal. No respiratory distress. She has no wheezes.  Lymphadenopathy:    She has no cervical adenopathy.  Neurological: She is alert and oriented to person, place, and time.  Psychiatric: Her behavior is normal. Judgment and thought content normal.  Slightly flat affect          Assessment & Plan:

## 2015-01-27 LAB — EPSTEIN-BARR VIRUS VCA ANTIBODY PANEL
EBV EA IgG: <5 U/mL (ref <9.0)
EBV NA IgG: <3 U/mL (ref <18.0)
EBV VCA IgG: <10 U/mL (ref <18.0)
EBV VCA IgM: <10 U/mL (ref <36.0)

## 2015-01-29 ENCOUNTER — Encounter: Payer: Self-pay | Admitting: Family

## 2015-02-10 ENCOUNTER — Telehealth: Payer: Self-pay | Admitting: *Deleted

## 2015-02-10 ENCOUNTER — Encounter: Payer: Self-pay | Admitting: *Deleted

## 2015-02-10 NOTE — Telephone Encounter (Signed)
Pre-Visit Call completed with patient and chart updated.   Pre-Visit Info documented in Specialty Comments under Snap[employer]ot.    

## 2015-02-11 ENCOUNTER — Encounter: Payer: Self-pay | Admitting: Family

## 2015-02-13 ENCOUNTER — Telehealth: Payer: Self-pay | Admitting: Family

## 2015-02-13 NOTE — Telephone Encounter (Signed)
Pre Visit letter sent  °

## 2015-03-05 ENCOUNTER — Telehealth: Payer: Self-pay | Admitting: *Deleted

## 2015-03-05 NOTE — Telephone Encounter (Signed)
Unable to reach patient at time of Pre-Visit Call.  Left message for patient to return call when available.    

## 2015-03-06 ENCOUNTER — Ambulatory Visit (INDEPENDENT_AMBULATORY_CARE_PROVIDER_SITE_OTHER): Payer: No Typology Code available for payment source | Admitting: Family

## 2015-03-06 ENCOUNTER — Encounter: Payer: Self-pay | Admitting: Family

## 2015-03-06 VITALS — BP 104/72 | HR 88 | Temp 97.9°F | Resp 16 | Ht 66.0 in | Wt 188.2 lb

## 2015-03-06 DIAGNOSIS — F4010 Social phobia, unspecified: Secondary | ICD-10-CM | POA: Insufficient documentation

## 2015-03-06 DIAGNOSIS — Z0000 Encounter for general adult medical examination without abnormal findings: Secondary | ICD-10-CM

## 2015-03-06 LAB — URINALYSIS, ROUTINE W REFLEX MICROSCOPIC
Bilirubin Urine: NEGATIVE
Hgb urine dipstick: NEGATIVE
Ketones, ur: NEGATIVE
Nitrite: NEGATIVE
Specific Gravity, Urine: 1.025 (ref 1.000–1.030)
Total Protein, Urine: NEGATIVE
Urine Glucose: NEGATIVE
Urobilinogen, UA: 0.2 (ref 0.0–1.0)
pH: 6 (ref 5.0–8.0)

## 2015-03-06 LAB — HEPATIC FUNCTION PANEL
ALT: 13 U/L (ref 0–35)
AST: 17 U/L (ref 0–37)
Albumin: 4.6 g/dL (ref 3.5–5.2)
Alkaline Phosphatase: 87 U/L (ref 39–117)
Bilirubin, Direct: 0.1 mg/dL (ref 0.0–0.3)
Total Bilirubin: 0.5 mg/dL (ref 0.2–1.2)
Total Protein: 7.5 g/dL (ref 6.0–8.3)

## 2015-03-06 LAB — LIPID PANEL
Cholesterol: 129 mg/dL (ref 0–200)
HDL: 34.2 mg/dL — ABNORMAL LOW (ref >39.00)
LDL Cholesterol: 80 mg/dL (ref 0–99)
NonHDL: 94.8
Total CHOL/HDL Ratio: 4
Triglycerides: 76 mg/dL (ref 0.0–149.0)
VLDL: 15.2 mg/dL (ref 0.0–40.0)

## 2015-03-06 LAB — BASIC METABOLIC PANEL
BUN: 9 mg/dL (ref 6–23)
CO2: 27 mEq/L (ref 19–32)
Calcium: 9.4 mg/dL (ref 8.4–10.5)
Chloride: 104 mEq/L (ref 96–112)
Creatinine, Ser: 0.67 mg/dL (ref 0.40–1.20)
GFR: 118.59 mL/min (ref >60.00)
Glucose, Bld: 89 mg/dL (ref 70–99)
Potassium: 3.7 mEq/L (ref 3.5–5.1)
Sodium: 136 mEq/L (ref 135–145)

## 2015-03-06 NOTE — Progress Notes (Signed)
Pre visit review using our clinic review tool, if applicable. No additional management support is needed unless otherwise documented below in the visit note. 

## 2015-03-06 NOTE — Patient Instructions (Signed)
Please complete lab work prior to leaving. Contact [facility] to schedule an appointment with one of our therapists. Follow up in 6 months, sooner if anxiety worsens or does not improve.

## 2015-03-06 NOTE — Progress Notes (Signed)
Subjective:    Patient ID: Rebekah Carter, female    DOB: 1996-01-13, 18 y.o.   MRN: [id number]  HPI  Rebekah Carter is a 19 yr old female who presents today for cpx.    Immunizations: up to date Diet: healthy Pap: to begin at 22- not sexually active Exercise- treadmill (walking) 1 hour 3 times a week Vision- last eye exam 2 months ago Dental:  1 year ago  :Review of Systems  Constitutional: Negative for unexpected weight change.  HENT: Negative for hearing loss and rhinorrhea.   Eyes: Negative for visual disturbance.  Respiratory: Negative for cough.   Cardiovascular: Negative for leg swelling.  Gastrointestinal: Negative for nausea, diarrhea and constipation.  Genitourinary: Negative for dysuria and frequency.       Periods last about 7 days  Musculoskeletal: Negative for myalgias and arthralgias.       Occasional back pain  Skin: Negative for rash.  Neurological: Negative for headaches.  Hematological: Negative for adenopathy.  Psychiatric/Behavioral: Negative for dysphoric mood and agitation.       Reports that she becomes anxious around crowds.   Feels like quitting her job made her feel worse. Gets panic attacks around people.    Past Medical History  Diagnosis Date  . Seizures     History   Social History  . Marital Status: Single    Spouse Name: N/A  . Number of Children: N/A  . Years of Education: N/A   Occupational History  . Not on file.   Social History Main Topics  . Smoking status: Never Smoker   . Smokeless tobacco: Never Used  . Alcohol Use: No  . Drug Use: No  . Sexual Activity: No   Other Topics Concern  . 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          Past Surgical History  Procedure Laterality Date  . Tonsillectomy and adenoidectomy  2004    Family History  Problem Relation Age of Onset  . Diabetes Maternal Grandfather    . Hypertension Maternal Grandfather   . Diabetes Paternal Grandfather   . Hypertension Paternal Grandfather     Allergies  Allergen Reactions  . Amoxicillin     REACTION: rash,seizures    Current Outpatient Prescriptions on File Prior to Visit  Medication Sig Dispense Refill  . cetirizine (ZYRTEC) 10 MG tablet Take 1 tablet (10 mg total) by mouth daily. 30 tablet 5  . mometasone (ELOCON) 0.1 % cream Apply 1 application topically daily. prn 45 g 1   No current facility-administered medications on file prior to visit.    BP 104/72 mmHg  Pulse 88  Temp(Src) 97.9 F (36.6 C) (Oral)  Resp 16  Ht  (1.676 m)  Wt 188 lb 3.2 oz (85.367 kg)  BMI 30.39 kg/m2  SpO2 99%  LMP 02/08/2015   Wt Readings from Last 3 Encounters:  03/06/15 188 lb 3.2 oz (85.367 kg)  01/26/15 191 lb 9.6 oz (86.909 kg)  08/01/14 191 lb (86.637 kg) (96 %*, Z = 1.79)   * Growth percentiles are based on CDC 2-20 Years data.       Objective:   Physical Exam  Physical Exam  Constitutional: She is oriented to person, place, and time. She appears well-developed and well-nourished. No distress.  HENT:  Head: Normocephalic and atraumatic.  Right Ear: Tympanic membrane and ear canal normal.  Left Ear: Tympanic membrane and ear canal normal.  Mouth/Throat: Oropharynx is clear and moist.  Eyes: Pupils are equal, round, and reactive to light. No scleral icterus.  Neck: Normal range of motion. No thyromegaly present.  Cardiovascular: Normal rate and regular rhythm.   No murmur heard. Pulmonary/Chest: Effort normal and breath sounds normal. No respiratory distress. He has no wheezes. She has no rales. She exhibits no tenderness.  Abdominal: Soft. Bowel sounds are normal. He exhibits no distension and no mass. There is no tenderness. There is no rebound and no guarding.  Musculoskeletal: She exhibits no edema.  Lymphadenopathy:    She has no cervical adenopathy.  Neurological: She is alert and oriented to  person, place, and time. She has normal patella reflexes. She exhibits normal muscle tone. Coordination normal.  Skin: Skin is warm and dry.  Psychiatric: she became briefly tearful upon discussion of her social anxiety. Her behavior is normal. Judgment and thought content normal.  Breasts: Examined lying Right: Without masses, retractions, discharge or axillary adenopathy.  Left: Without masses, retractions, discharge or axillary adenopathy.  Pelvic: deferred.        Assessment & Plan:         Assessment & Plan:

## 2015-03-06 NOTE — Assessment & Plan Note (Signed)
Discussed continuing healthy diet and exercise. Discussed weight loss with goal BMI of 25 or less. Immunizations reviewed and up to date.

## 2015-03-06 NOTE — Assessment & Plan Note (Signed)
We discussed possibility of SSRI and/or referral to counseling. She would like to start with counseling and I have advised her to arrange a follow up appointment.

## 2015-03-12 ENCOUNTER — Telehealth: Payer: Self-pay | Admitting: Family

## 2015-03-12 NOTE — Telephone Encounter (Signed)
Could you please see if lab can add on hiv? It was ordered but not sent. Thanks.

## 2015-03-12 NOTE — Telephone Encounter (Signed)
David--Can you check status of HIV test ordered on 03/06/15?

## 2015-03-12 NOTE — Addendum Note (Signed)
Addended by: Eustace QuailEABOLD, Daison Braxton J on: 03/12/2015 09:23 AM   Modules accepted: Orders

## 2015-03-12 NOTE — Telephone Encounter (Signed)
[facility] never received . Will send extra gold top.

## 2015-03-13 ENCOUNTER — Encounter: Payer: Self-pay | Admitting: Family

## 2015-03-13 LAB — HIV ANTIBODY (ROUTINE TESTING W REFLEX): HIV 1&2 Ab, 4th Generation: NONREACTIVE

## 2015-09-07 ENCOUNTER — Telehealth: Payer: Self-pay | Admitting: Family

## 2015-09-07 ENCOUNTER — Ambulatory Visit: Payer: Self-pay | Admitting: Family

## 2015-09-11 NOTE — Telephone Encounter (Signed)
Pt was no show 09/07/15 10:45am for follow up, 1st no show, called pt and spoke with someone day before stated she needed to reschedule because she doesn't have ins right now, she said she was told ok they would cancel but they didn't even ask her birth date, she said she called back but was on hold and didn't talk to anyone, she will call when she has insurance again, charge or no charge?

## 2015-09-13 NOTE — Telephone Encounter (Signed)
No charge. 

## 2015-11-13 ENCOUNTER — Other Ambulatory Visit: Payer: Self-pay | Admitting: Surgical

## 2015-11-24 NOTE — Patient Instructions (Signed)
Rebekah Carter  11/24/2015   Your procedure is scheduled on: 12/02/2015    Report to [facility] Main  Entrance take [facility]  elevators to 3rd floor to  Short Stay Center at   1020 AM.  Call this number if you have problems the morning of surgery [phone]   Remember: ONLY 1 PERSON MAY GO WITH YOU TO SHORT STAY TO GET  READY MORNING OF YOUR SURGERY.  Do not eat food or drink liquids :After Midnight.     Take these medicines the morning of surgery with A SIP OF WATER: Zyrtec                                 You may not have any metal on your body including hair pins and              piercings  Do not wear jewelry, make-up, lotions, powders or perfumes, deodorant             Do not wear nail polish.  Do not shave  48 hours prior to surgery.               Do not bring valuables to the hospital. [name] IS NOT             RESPONSIBLE   FOR VALUABLES.  Contacts, dentures or bridgework may not be worn into surgery.  Leave suitcase in the car. After surgery it may be brought to your room.         Special Instructions: coughing and deep breathing exercises,leg exercises               Please read over the following fact sheets you were given: _____________________________________________________________________             [facility] - Preparing for Surgery Before surgery, you can play an important role.  Because skin is not sterile, your skin needs to be as free of germs as possible.  You can reduce the number of germs on your skin by washing with CHG (chlorahexidine gluconate) soap before surgery.  CHG is an antiseptic cleaner which kills germs and bonds with the skin to continue killing germs even after washing. Please DO NOT use if you have an allergy to CHG or antibacterial soaps.  If your skin becomes reddened/irritated stop using the CHG and inform your nurse when you arrive at Short Stay. Do not shave (including legs and underarms) for at least 48  hours prior to the first CHG shower.  You may shave your face/neck. Please follow these instructions carefully:  1.  Shower with CHG Soap the night before surgery and the  morning of Surgery.  2.  If you choose to wash your hair, wash your hair first as usual with your  normal  shampoo.  3.  After you shampoo, rinse your hair and body thoroughly to remove the  shampoo.                           4.  Use CHG as you would any other liquid soap.  You can apply chg directly  to the skin and wash                       Gently with a scrungie  or clean washcloth.  5.  Apply the CHG Soap to your body ONLY FROM THE NECK DOWN.   Do not use on face/ open                           Wound or open sores. Avoid contact with eyes, ears mouth and genitals (private parts).                       Wash face,  Genitals (private parts) with your normal soap.             6.  Wash thoroughly, paying special attention to the area where your surgery  will be performed.  7.  Thoroughly rinse your body with warm water from the neck down.  8.  DO NOT shower/wash with your normal soap after using and rinsing off  the CHG Soap.                9.  Pat yourself dry with a clean towel.            10.  Wear clean pajamas.            11.  Place clean sheets on your bed the night of your first shower and do not  sleep with pets. Day of Surgery : Do not apply any lotions/deodorants the morning of surgery.  Please wear clean clothes to the hospital/surgery center.  FAILURE TO FOLLOW THESE INSTRUCTIONS MAY RESULT IN THE CANCELLATION OF YOUR SURGERY PATIENT SIGNATURE_________________________________  NURSE SIGNATURE__________________________________  ________________________________________________________________________   Rebekah Carter  An incentive spirometer is a tool that can help keep your lungs clear and active. This tool measures how well you are filling your lungs with each breath. Taking long deep breaths may help  reverse or decrease the chance of developing breathing (pulmonary) problems (especially infection) following:  A long period of time when you are unable to move or be active. BEFORE THE PROCEDURE   If the spirometer includes an indicator to show your best effort, your nurse or respiratory therapist will set it to a desired goal.  If possible, sit up straight or lean slightly forward. Try not to slouch.  Hold the incentive spirometer in an upright position. INSTRUCTIONS FOR USE  1. Sit on the edge of your bed if possible, or sit up as far as you can in bed or on a chair. 2. Hold the incentive spirometer in an upright position. 3. Breathe out normally. 4. Place the mouthpiece in your mouth and seal your lips tightly around it. 5. Breathe in slowly and as deeply as possible, raising the piston or the ball toward the top of the column. 6. Hold your breath for 3-5 seconds or for as long as possible. Allow the piston or ball to fall to the bottom of the column. 7. Remove the mouthpiece from your mouth and breathe out normally. 8. Rest for a few seconds and repeat Steps 1 through 7 at least 10 times every 1-2 hours when you are awake. Take your time and take a few normal breaths between deep breaths. 9. The spirometer may include an indicator to show your best effort. Use the indicator as a goal to work toward during each repetition. 10. After each set of 10 deep breaths, practice coughing to be sure your lungs are clear. If you have an incision (the cut made at the time of surgery), support your incision when  coughing by placing a pillow or rolled up towels firmly against it. Once you are able to get out of bed, walk around indoors and cough well. You may stop using the incentive spirometer when instructed by your caregiver.  RISKS AND COMPLICATIONS  Take your time so you do not get dizzy or light-headed.  If you are in pain, you may need to take or ask for pain medication before doing incentive  spirometry. It is harder to take a deep breath if you are having pain. AFTER USE  Rest and breathe slowly and easily.  It can be helpful to keep track of a log of your progress. Your caregiver can provide you with a simple table to help with this. If you are using the spirometer at home, follow these instructions: SEEK MEDICAL CARE IF:   You are having difficultly using the spirometer.  You have trouble using the spirometer as often as instructed.  Your pain medication is not giving enough relief while using the spirometer.  You develop fever of 100.5 F (38.1 C) or higher. SEEK IMMEDIATE MEDICAL CARE IF:   You cough up bloody sputum that had not been present before.  You develop fever of 102 F (38.9 C) or greater.  You develop worsening pain at or near the incision site. MAKE SURE YOU:   Understand these instructions.  Will watch your condition.  Will get help right away if you are not doing well or get worse. Document Released: 02/06/2007 Document Revised: 12/19/2011 Document Reviewed: 04/09/2007 [facility] Patient Information 2014 [place], [place].   ________________________________________________________________________

## 2015-11-25 ENCOUNTER — Ambulatory Visit (HOSPITAL_COMMUNITY)
Admission: RE | Admit: 2015-11-25 | Discharge: 2015-11-25 | Disposition: A | Discharge status: Home / Self Care | Payer: MEDICAID | Source: Ambulatory Visit | Attending: Surgical | Admitting: Surgical

## 2015-11-25 ENCOUNTER — Encounter (HOSPITAL_COMMUNITY)
Admission: RE | Admit: 2015-11-25 | Discharge: 2015-11-25 | Disposition: A | Discharge status: Home / Self Care | Payer: Self-pay | Source: Ambulatory Visit | Attending: Orthopedic Surgery | Admitting: Orthopedic Surgery

## 2015-11-25 ENCOUNTER — Encounter (HOSPITAL_COMMUNITY): Payer: Self-pay

## 2015-11-25 DIAGNOSIS — Z01812 Encounter for preprocedural laboratory examination: Secondary | ICD-10-CM | POA: Insufficient documentation

## 2015-11-25 DIAGNOSIS — Z01818 Encounter for other preprocedural examination: Secondary | ICD-10-CM | POA: Insufficient documentation

## 2015-11-25 HISTORY — DX: Unspecified asthma, uncomplicated: J45.909

## 2015-11-25 HISTORY — DX: Anemia, unspecified: D64.9

## 2015-11-25 HISTORY — DX: Anxiety disorder, unspecified: F41.9

## 2015-11-25 LAB — COMPREHENSIVE METABOLIC PANEL
ALT: 17 U/L (ref 14–54)
AST: 20 U/L (ref 15–41)
Albumin: 5.1 g/dL — ABNORMAL HIGH (ref 3.5–5.0)
Alkaline Phosphatase: 83 U/L (ref 38–126)
Anion gap: 10 (ref 5–15)
BUN: 16 mg/dL (ref 6–20)
CO2: 23 mmol/L (ref 22–32)
Calcium: 10 mg/dL (ref 8.9–10.3)
Chloride: 105 mmol/L (ref 101–111)
Creatinine, Ser: 0.63 mg/dL (ref 0.44–1.00)
GFR calc Af Amer: >60 mL/min (ref >60)
GFR calc non Af Amer: >60 mL/min (ref >60)
Glucose, Bld: 114 mg/dL — ABNORMAL HIGH (ref 65–99)
Potassium: 4.1 mmol/L (ref 3.5–5.1)
Sodium: 138 mmol/L (ref 135–145)
Total Bilirubin: 0.5 mg/dL (ref 0.3–1.2)
Total Protein: 8.2 g/dL — ABNORMAL HIGH (ref 6.5–8.1)

## 2015-11-25 LAB — CBC WITH DIFFERENTIAL/PLATELET
Basophils Absolute: 0 10*3/uL (ref 0.0–0.1)
Basophils Relative: 0 %
Eosinophils Absolute: 0.5 10*3/uL (ref 0.0–0.7)
Eosinophils Relative: 4 %
HCT: 40.1 % (ref 36.0–46.0)
Hemoglobin: 13 g/dL (ref 12.0–15.0)
Lymphocytes Relative: 19 %
Lymphs Abs: 2.5 10*3/uL (ref 0.7–4.0)
MCH: 29.4 pg (ref 26.0–34.0)
MCHC: 32.4 g/dL (ref 30.0–36.0)
MCV: 90.7 fL (ref 78.0–100.0)
Monocytes Absolute: 0.8 10*3/uL (ref 0.1–1.0)
Monocytes Relative: 6 %
Neutro Abs: 9.2 10*3/uL — ABNORMAL HIGH (ref 1.7–7.7)
Neutrophils Relative %: 71 %
Platelets: 251 10*3/uL (ref 150–400)
RBC: 4.42 MIL/uL (ref 3.87–5.11)
RDW: 13 % (ref 11.5–15.5)
WBC: 12.9 10*3/uL — ABNORMAL HIGH (ref 4.0–10.5)

## 2015-11-25 LAB — SURGICAL PCR SCREEN
MRSA, PCR: NEGATIVE
Staphylococcus aureus: NEGATIVE

## 2015-11-25 LAB — PROTIME-INR
INR: 1.05 (ref 0.00–1.49)
Prothrombin Time: 13.9 seconds (ref 11.6–15.2)

## 2015-11-25 LAB — HCG, SERUM, QUALITATIVE: Preg, Serum: NEGATIVE

## 2015-11-25 LAB — NO BLOOD PRODUCTS

## 2015-11-25 NOTE — Progress Notes (Signed)
Faxed to office of Dr Darrelyn Hillock and confirmation received - Health Care Power of Attorney and Blood Product Refusal Form signed and witnessed on 11/25/2015.   Also faxed to [facility] and confirmation received on 11/25/2015.   Document in FYI , Allergies and MD orders.

## 2015-11-25 NOTE — Progress Notes (Signed)
CBC done 11/25/15 faxed via [facility] to Dr Darrelyn Hillock.

## 2015-11-26 NOTE — Progress Notes (Signed)
Final EKG done 11/25/15 - [facility]  

## 2015-12-02 ENCOUNTER — Observation Stay (HOSPITAL_COMMUNITY)
Admission: RE | Admit: 2015-12-02 | Discharge: 2015-12-03 | Disposition: A | Discharge status: Home / Self Care | Payer: Self-pay | Source: Ambulatory Visit | Attending: Orthopedic Surgery | Admitting: Orthopedic Surgery

## 2015-12-02 ENCOUNTER — Inpatient Hospital Stay (HOSPITAL_COMMUNITY): Payer: MEDICAID

## 2015-12-02 ENCOUNTER — Encounter (HOSPITAL_COMMUNITY)
Admission: RE | Disposition: A | Discharge status: Home / Self Care | Payer: Self-pay | Source: Ambulatory Visit | Attending: Orthopedic Surgery

## 2015-12-02 ENCOUNTER — Inpatient Hospital Stay (HOSPITAL_COMMUNITY): Payer: MEDICAID | Admitting: Certified Registered"

## 2015-12-02 ENCOUNTER — Encounter (HOSPITAL_COMMUNITY): Payer: Self-pay

## 2015-12-02 DIAGNOSIS — M48062 Spinal stenosis, lumbar region with neurogenic claudication: Secondary | ICD-10-CM | POA: Diagnosis present

## 2015-12-02 DIAGNOSIS — M21371 Foot drop, right foot: Secondary | ICD-10-CM | POA: Insufficient documentation

## 2015-12-02 DIAGNOSIS — M4806 Spinal stenosis, lumbar region: Principal | ICD-10-CM | POA: Insufficient documentation

## 2015-12-02 DIAGNOSIS — M5126 Other intervertebral disc displacement, lumbar region: Secondary | ICD-10-CM | POA: Insufficient documentation

## 2015-12-02 DIAGNOSIS — Z419 Encounter for procedure for purposes other than remedying health state, unspecified: Secondary | ICD-10-CM

## 2015-12-02 DIAGNOSIS — M4807 Spinal stenosis, lumbosacral region: Secondary | ICD-10-CM | POA: Insufficient documentation

## 2015-12-02 DIAGNOSIS — M5127 Other intervertebral disc displacement, lumbosacral region: Secondary | ICD-10-CM | POA: Insufficient documentation

## 2015-12-02 DIAGNOSIS — J45909 Unspecified asthma, uncomplicated: Secondary | ICD-10-CM | POA: Insufficient documentation

## 2015-12-02 DIAGNOSIS — Z79899 Other long term (current) drug therapy: Secondary | ICD-10-CM | POA: Insufficient documentation

## 2015-12-02 HISTORY — PX: LUMBAR LAMINECTOMY/DECOMPRESSION MICRODISCECTOMY: SHX5026

## 2015-12-02 SURGERY — LUMBAR LAMINECTOMY/DECOMPRESSION MICRODISCECTOMY 2 LEVELS
Anesthesia: General | Site: Back | Laterality: Right

## 2015-12-02 MED ORDER — FENTANYL CITRATE (PF) 250 MCG/5ML IJ SOLN
INTRAMUSCULAR | Status: AC
  Filled 2015-12-02: qty 5

## 2015-12-02 MED ORDER — VANCOMYCIN HCL IN DEXTROSE 1-5 GM/200ML-% IV SOLN
1000.0000 mg | INTRAVENOUS | Status: AC
  Administered 2015-12-02: 1000 mg via INTRAVENOUS
  Filled 2015-12-02: qty 200

## 2015-12-02 MED ORDER — BACITRACIN-NEOMYCIN-POLYMYXIN 400-5-5000 EX OINT
TOPICAL_OINTMENT | CUTANEOUS | Status: AC
  Filled 2015-12-02: qty 1

## 2015-12-02 MED ORDER — VANCOMYCIN HCL IN DEXTROSE 1-5 GM/200ML-% IV SOLN
1000.0000 mg | Freq: Once | INTRAVENOUS | Status: AC
  Administered 2015-12-02: 1000 mg via INTRAVENOUS
  Filled 2015-12-02: qty 200

## 2015-12-02 MED ORDER — CHLORHEXIDINE GLUCONATE 4 % EX LIQD: 60.0000 mL | Freq: Once | CUTANEOUS | Status: DC

## 2015-12-02 MED ORDER — POLYETHYLENE GLYCOL 3350 17 G PO PACK: 17.0000 g | PACK | Freq: Every day | ORAL | Status: DC | PRN

## 2015-12-02 MED ORDER — ONDANSETRON HCL 4 MG/2ML IJ SOLN
INTRAMUSCULAR | Status: DC | PRN
  Administered 2015-12-02 (×4): 2 mg via INTRAVENOUS

## 2015-12-02 MED ORDER — DEXAMETHASONE SODIUM PHOSPHATE 10 MG/ML IJ SOLN
INTRAMUSCULAR | Status: DC | PRN
  Administered 2015-12-02: 10 mg via INTRAVENOUS

## 2015-12-02 MED ORDER — PROPOFOL 10 MG/ML IV BOLUS
INTRAVENOUS | Status: AC
  Filled 2015-12-02: qty 20

## 2015-12-02 MED ORDER — HYDROMORPHONE HCL 1 MG/ML IJ SOLN
INTRAMUSCULAR | Status: AC
  Filled 2015-12-02: qty 1

## 2015-12-02 MED ORDER — FLEET ENEMA 7-19 GM/118ML RE ENEM: 1.0000 | ENEMA | Freq: Once | RECTAL | Status: DC | PRN

## 2015-12-02 MED ORDER — EPHEDRINE SULFATE 50 MG/ML IJ SOLN
INTRAMUSCULAR | Status: AC
  Filled 2015-12-02: qty 1

## 2015-12-02 MED ORDER — PHENYLEPHRINE HCL 10 MG/ML IJ SOLN
INTRAMUSCULAR | Status: DC | PRN
  Administered 2015-12-02 (×2): 80 ug via INTRAVENOUS
  Administered 2015-12-02: 40 ug via INTRAVENOUS

## 2015-12-02 MED ORDER — BISACODYL 5 MG PO TBEC: 5.0000 mg | DELAYED_RELEASE_TABLET | Freq: Every day | ORAL | Status: DC | PRN

## 2015-12-02 MED ORDER — LACTATED RINGERS IV SOLN
INTRAVENOUS | Status: DC
  Administered 2015-12-02 (×3): via INTRAVENOUS

## 2015-12-02 MED ORDER — ONDANSETRON HCL 4 MG/2ML IJ SOLN: 4.0000 mg | INTRAMUSCULAR | Status: DC | PRN

## 2015-12-02 MED ORDER — MIDAZOLAM HCL 5 MG/5ML IJ SOLN
INTRAMUSCULAR | Status: DC | PRN
  Administered 2015-12-02: 2 mg via INTRAVENOUS

## 2015-12-02 MED ORDER — BUPIVACAINE-EPINEPHRINE (PF) 0.5% -1:200000 IJ SOLN
INTRAMUSCULAR | Status: AC
  Filled 2015-12-02: qty 30

## 2015-12-02 MED ORDER — ONDANSETRON HCL 4 MG/2ML IJ SOLN
INTRAMUSCULAR | Status: AC
  Filled 2015-12-02: qty 4

## 2015-12-02 MED ORDER — PHENOL 1.4 % MT LIQD: 1.0000 | OROMUCOSAL | Status: DC | PRN

## 2015-12-02 MED ORDER — ACETAMINOPHEN 650 MG RE SUPP: 650.0000 mg | RECTAL | Status: DC | PRN

## 2015-12-02 MED ORDER — BUPIVACAINE LIPOSOME 1.3 % IJ SUSP
20.0000 mL | Freq: Once | INTRAMUSCULAR | Status: AC
  Administered 2015-12-02: 20 mL
  Filled 2015-12-02: qty 20

## 2015-12-02 MED ORDER — POLYMYXIN B SULFATE 500000 UNITS IJ SOLR
INTRAMUSCULAR | Status: AC
  Filled 2015-12-02: qty 1

## 2015-12-02 MED ORDER — MENTHOL 3 MG MT LOZG: 1.0000 | LOZENGE | OROMUCOSAL | Status: DC | PRN

## 2015-12-02 MED ORDER — MIDAZOLAM HCL 2 MG/2ML IJ SOLN
INTRAMUSCULAR | Status: AC
  Filled 2015-12-02: qty 2

## 2015-12-02 MED ORDER — FENTANYL CITRATE (PF) 100 MCG/2ML IJ SOLN
INTRAMUSCULAR | Status: DC | PRN
  Administered 2015-12-02: 100 ug via INTRAVENOUS
  Administered 2015-12-02: 50 ug via INTRAVENOUS

## 2015-12-02 MED ORDER — METHOCARBAMOL 1000 MG/10ML IJ SOLN
500.0000 mg | Freq: Four times a day (QID) | INTRAMUSCULAR | Status: DC | PRN
  Administered 2015-12-02: 500 mg via INTRAVENOUS
  Filled 2015-12-02 (×2): qty 5

## 2015-12-02 MED ORDER — ROCURONIUM BROMIDE 100 MG/10ML IV SOLN
INTRAVENOUS | Status: DC | PRN
  Administered 2015-12-02: 20 mg via INTRAVENOUS
  Administered 2015-12-02: 50 mg via INTRAVENOUS
  Administered 2015-12-02: 10 mg via INTRAVENOUS

## 2015-12-02 MED ORDER — SUGAMMADEX SODIUM 200 MG/2ML IV SOLN
INTRAVENOUS | Status: DC | PRN
  Administered 2015-12-02: 200 mg via INTRAVENOUS

## 2015-12-02 MED ORDER — PROPOFOL 10 MG/ML IV BOLUS
INTRAVENOUS | Status: DC | PRN
  Administered 2015-12-02: 200 mg via INTRAVENOUS

## 2015-12-02 MED ORDER — PHENYLEPHRINE 40 MCG/ML (10ML) SYRINGE FOR IV PUSH (FOR BLOOD PRESSURE SUPPORT)
PREFILLED_SYRINGE | INTRAVENOUS | Status: AC
  Filled 2015-12-02: qty 10

## 2015-12-02 MED ORDER — SODIUM CHLORIDE 0.9 % IR SOLN
Status: DC | PRN
  Administered 2015-12-02: 500 mL

## 2015-12-02 MED ORDER — BUPIVACAINE-EPINEPHRINE (PF) 0.5% -1:200000 IJ SOLN
INTRAMUSCULAR | Status: DC | PRN
Start: 2015-12-02 — End: 2015-12-02
  Administered 2015-12-02: 20 mL via PERINEURAL

## 2015-12-02 MED ORDER — ACETAMINOPHEN 325 MG PO TABS: 650.0000 mg | ORAL_TABLET | ORAL | Status: DC | PRN

## 2015-12-02 MED ORDER — LIDOCAINE HCL (CARDIAC) 20 MG/ML IV SOLN
INTRAVENOUS | Status: AC
  Filled 2015-12-02: qty 5

## 2015-12-02 MED ORDER — THROMBIN 5000 UNITS EX SOLR
CUTANEOUS | Status: AC
  Filled 2015-12-02: qty 10000

## 2015-12-02 MED ORDER — SUGAMMADEX SODIUM 200 MG/2ML IV SOLN
INTRAVENOUS | Status: AC
  Filled 2015-12-02: qty 2

## 2015-12-02 MED ORDER — DEXAMETHASONE SODIUM PHOSPHATE 10 MG/ML IJ SOLN
INTRAMUSCULAR | Status: AC
  Filled 2015-12-02: qty 1

## 2015-12-02 MED ORDER — BACITRACIN-NEOMYCIN-POLYMYXIN 400-5-5000 EX OINT
TOPICAL_OINTMENT | CUTANEOUS | Status: DC | PRN
  Administered 2015-12-02: 1 via TOPICAL

## 2015-12-02 MED ORDER — LIDOCAINE HCL (CARDIAC) 20 MG/ML IV SOLN
INTRAVENOUS | Status: DC | PRN
  Administered 2015-12-02: 100 mg via INTRAVENOUS

## 2015-12-02 MED ORDER — OXYCODONE-ACETAMINOPHEN 5-325 MG PO TABS: 1.0000 | ORAL_TABLET | ORAL | Status: DC | PRN

## 2015-12-02 MED ORDER — HYDROCODONE-ACETAMINOPHEN 5-325 MG PO TABS
1.0000 | ORAL_TABLET | ORAL | Status: DC | PRN
  Administered 2015-12-02 – 2015-12-03 (×3): 1 via ORAL
  Filled 2015-12-02 (×3): qty 1

## 2015-12-02 MED ORDER — HYDROMORPHONE HCL 1 MG/ML IJ SOLN
0.2500 mg | INTRAMUSCULAR | Status: DC | PRN
  Administered 2015-12-02 (×4): 0.5 mg via INTRAVENOUS

## 2015-12-02 MED ORDER — HYDROMORPHONE HCL 1 MG/ML IJ SOLN
0.5000 mg | INTRAMUSCULAR | Status: DC | PRN
  Administered 2015-12-03: 0.5 mg via INTRAVENOUS
  Filled 2015-12-02 (×2): qty 1

## 2015-12-02 MED ORDER — LACTATED RINGERS IV SOLN
INTRAVENOUS | Status: DC
Start: 2015-12-02 — End: 2015-12-03

## 2015-12-02 MED ORDER — HYDROMORPHONE HCL 1 MG/ML IJ SOLN: 0.2500 mg | INTRAMUSCULAR | Status: DC | PRN

## 2015-12-02 MED ORDER — THROMBIN 5000 UNITS EX SOLR
CUTANEOUS | Status: DC | PRN
  Administered 2015-12-02: 5000 [IU] via TOPICAL

## 2015-12-02 MED ORDER — METHOCARBAMOL 500 MG PO TABS: 500.0000 mg | ORAL_TABLET | Freq: Four times a day (QID) | ORAL | Status: DC | PRN

## 2015-12-02 MED ORDER — ROCURONIUM BROMIDE 100 MG/10ML IV SOLN
INTRAVENOUS | Status: AC
  Filled 2015-12-02: qty 1

## 2015-12-02 SURGICAL SUPPLY — 43 items
BAG ZIPLOCK 12X15 (MISCELLANEOUS) IMPLANT
BENZOIN TINCTURE PRP APPL 2/3 (GAUZE/BANDAGES/DRESSINGS) ×2 IMPLANT
CLEANER TIP ELECTROSURG 2X2 (MISCELLANEOUS) ×2 IMPLANT
DRAPE MICROSCOPE LEICA (MISCELLANEOUS) ×2 IMPLANT
DRAPE POUCH INSTRU U-SHP 10X18 (DRAPES) ×2 IMPLANT
DRAPE SHEET LG 3/4 BI-LAMINATE (DRAPES) ×2 IMPLANT
DRAPE SURG 17X11 SM STRL (DRAPES) ×2 IMPLANT
DRSG ADAPTIC 3X8 NADH LF (GAUZE/BANDAGES/DRESSINGS) ×2 IMPLANT
DRSG PAD ABDOMINAL 8X10 ST (GAUZE/BANDAGES/DRESSINGS) ×8 IMPLANT
DURAPREP 26ML APPLICATOR (WOUND CARE) ×2 IMPLANT
ELECT BLADE TIP CTD 4 INCH (ELECTRODE) ×2 IMPLANT
ELECT REM PT RETURN 9FT ADLT (ELECTROSURGICAL) ×2
ELECTRODE REM PT RTRN 9FT ADLT (ELECTROSURGICAL) ×1 IMPLANT
GAUZE SPONGE 4X4 12PLY STRL (GAUZE/BANDAGES/DRESSINGS) ×2 IMPLANT
GLOVE BIOGEL PI IND STRL 8 (GLOVE) ×2 IMPLANT
GLOVE BIOGEL PI INDICATOR 8 (GLOVE) ×2
GOWN STRL REUS W/TWL XL LVL3 (GOWN DISPOSABLE) ×4 IMPLANT
KIT BASIN OR (CUSTOM PROCEDURE TRAY) ×2 IMPLANT
KIT POSITIONING SURG ANDREWS (MISCELLANEOUS) ×2 IMPLANT
MANIFOLD NEPTUNE II (INSTRUMENTS) ×2 IMPLANT
MARKER SKIN DUAL TIP RULER LAB (MISCELLANEOUS) ×2 IMPLANT
NEEDLE HYPO 22GX1.5 SAFETY (NEEDLE) ×2 IMPLANT
NEEDLE SPNL 18GX3.5 QUINCKE PK (NEEDLE) ×6 IMPLANT
PACK LAMINECTOMY ORTHO (CUSTOM PROCEDURE TRAY) ×2 IMPLANT
PATTIES SURGICAL .5 X.5 (GAUZE/BANDAGES/DRESSINGS) ×4 IMPLANT
PATTIES SURGICAL .75X.75 (GAUZE/BANDAGES/DRESSINGS) ×4 IMPLANT
PATTIES SURGICAL 1X1 (DISPOSABLE) ×2 IMPLANT
POSITIONER SURGICAL ARM (MISCELLANEOUS) ×2 IMPLANT
RUBBERBAND STERILE (MISCELLANEOUS) ×2 IMPLANT
SPONGE LAP 4X18 X RAY DECT (DISPOSABLE) ×4 IMPLANT
SPONGE SURGIFOAM ABS GEL 100 (HEMOSTASIS) ×2 IMPLANT
STAPLER VISISTAT 35W (STAPLE) ×2 IMPLANT
SUT VIC AB 0 CT1 27 (SUTURE)
SUT VIC AB 0 CT1 27XBRD ANTBC (SUTURE) IMPLANT
SUT VIC AB 1 CT1 27 (SUTURE) ×3
SUT VIC AB 1 CT1 27XBRD ANTBC (SUTURE) ×3 IMPLANT
SUT VIC AB 2-0 CT1 27 (SUTURE) ×2
SUT VIC AB 2-0 CT1 TAPERPNT 27 (SUTURE) ×2 IMPLANT
SYR 20CC LL (SYRINGE) ×4 IMPLANT
TAPE CLOTH SURG 4X10 WHT LF (GAUZE/BANDAGES/DRESSINGS) ×2 IMPLANT
TOWEL OR 17X26 10 PK STRL BLUE (TOWEL DISPOSABLE) ×2 IMPLANT
TOWEL OR NON WOVEN STRL DISP B (DISPOSABLE) ×2 IMPLANT
TRAY FOLEY BAG SILVER LF 16FR (SET/KITS/TRAYS/PACK) ×2 IMPLANT

## 2015-12-02 NOTE — Progress Notes (Signed)
Pharmacy - Brief Note (Surgical prophylaxis)  Consult received post op for " vancomycin 1 dose 12 hours post-op unless patient has a drain, then continue vancomycin until discontinued by physician."  Patent does not have drain documented and RN aware of drain.    Plan:  Give vancomycin 1gm IV x 1 approx 12h after pre-op dose  Pharmacy to sign off  Thanks, .dzd

## 2015-12-02 NOTE — Anesthesia Procedure Notes (Signed)
Procedure Name: Intubation Date/Time: 12/02/2015 12:39 PM Performed by: Army Fossa Pre-anesthesia Checklist: Patient identified, Emergency Drugs available, Suction available, Patient being monitored and Timeout performed Patient Re-evaluated:Patient Re-evaluated prior to inductionOxygen Delivery Method: Circle system utilized Preoxygenation: Pre-oxygenation with 100% oxygen Intubation Type: IV induction Ventilation: Mask ventilation without difficulty Laryngoscope Size: Mac and 3 Grade View: Grade III Tube type: Oral Tube size: 7.0 mm Number of attempts: 1 (Intubated by Christen Butter SRNA ) Airway Equipment and Method: Patient positioned with wedge pillow and Stylet Placement Confirmation: ETT inserted through vocal cords under direct vision,  positive ETCO2,  CO2 detector and breath sounds checked- equal and bilateral Secured at: 22 cm Tube secured with: Tape Dental Injury: Teeth and Oropharynx as per pre-operative assessment

## 2015-12-02 NOTE — Anesthesia Preprocedure Evaluation (Addendum)
Anesthesia Evaluation  Patient identified by MRN, date of birth, ID band Patient awake    Reviewed: Allergy & Precautions, H&P , Patient's Chart, lab work & pertinent test results, reviewed documented beta blocker date and time   Airway Mallampati: II  TM Distance: >3 FB Neck ROM: full    Dental no notable dental hx.    Pulmonary asthma ,    Pulmonary exam normal breath sounds clear to auscultation       Cardiovascular  Rhythm:regular Rate:Normal     Neuro/Psych Seizures -,     GI/Hepatic   Endo/Other    Renal/GU      Musculoskeletal   Abdominal   Peds  Hematology   Anesthesia Other Findings No Seizure meds Jehovah witness.Marland KitchenMarland KitchenHb 13  Reproductive/Obstetrics                            Anesthesia Physical Anesthesia Plan  ASA: II  Anesthesia Plan: General   Post-op Pain Management:    Induction: Intravenous  Airway Management Planned: Oral ETT  Additional Equipment:   Intra-op Plan:   Post-operative Plan: Extubation in OR  Informed Consent: I have reviewed the patients History and Physical, chart, labs and discussed the procedure including the risks, benefits and alternatives for the proposed anesthesia with the patient or authorized representative who has indicated his/her understanding and acceptance.   Dental Advisory Given and Dental advisory given  Plan Discussed with: CRNA and Surgeon  Anesthesia Plan Comments: (  Discussed general anesthesia, including possible nausea, instrumentation of airway, sore throat,pulmonary aspiration, etc. I asked if the were any outstanding questions, or  concerns before we proceeded. )        Anesthesia Quick Evaluation

## 2015-12-02 NOTE — Transfer of Care (Signed)
Immediate Anesthesia Transfer of Care Note  Patient: Rebekah Carter  Procedure(s) Performed: Procedure(s): CENTRAL DECOMPRESSION L5 - S1 AND L4 - L5 FOR SPINAL STENOSIS, MICRODISCECTOMY L4-L5, L5-S1, FORAMINOTOMIES L5 ADN S1 ROOT ON THE RIGHT (Right)  Patient Location: PACU  Anesthesia Type:General  Level of Consciousness: sedated, patient cooperative and responds to stimulation  Airway & Oxygen Therapy: Patient Spontanous Breathing and Patient connected to face mask oxygen  Post-op Assessment: Report given to RN and Post -op Vital signs reviewed and stable  Post vital signs: Reviewed and stable  Last Vitals:  Filed Vitals:   12/02/15 1014  BP: 134/74  Pulse: 103  Temp: 37 C  Resp: 18    Complications: No apparent anesthesia complications

## 2015-12-02 NOTE — Brief Op Note (Signed)
12/02/2015  2:55 PM  PATIENT:  Rebekah Carter  20 y.o. 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  POST-OPERATIVE DIAGNOSIS:Same as Pre-Op  PROCEDURE:  Procedure(s): CENTRAL DECOMPRESSION L5 - S1 AND L4 - L5 FOR SPINAL STENOSIS, MICRODISCECTOMY L4-L5, L5-S1, FORAMINOTOMIES L5 ADN S1 ROOT ON THE RIGHT (Right)  SURGEON:  Surgeon(s) and Role:    * Ranee Gosselin, MD - Primary  PHYSICIAN ASSISTANT:Amber Utica PA   ASSISTANTS: Dimitri Ped PA  ANESTHESIA:   general  EBL:  Total I/O In: 1000 [I.V.:1000] Out: 575 [Urine:500; Blood:75]  BLOOD ADMINISTERED:none  DRAINS: none   LOCAL MEDICATIONS USED:  MARCAINE 20cc of 0.50% with Epinephrine at the start and 20cc of Exparel at the end of the case.    SPECIMEN:  No Specimen  DISPOSITION OF SPECIMEN:  N/A  COUNTS:  YES  TOURNIQUET:  * No tourniquets in log *  DICTATION: .Other Dictation: Dictation Number [id number]  PLAN OF CARE: Admit for overnight observation  PATIENT DISPOSITION:  PACU - hemodynamically stable.   Delay start of Pharmacological VTE agent (>24hrs) due to surgical blood loss or risk of bleeding: yes

## 2015-12-02 NOTE — Op Note (Signed)
Rebekah Carter           ACCOUNT NO.:  [id number]  MEDICAL RECORD NO.:  [id number]  LOCATION:  1607                         FACILITY:  [facility]  PHYSICIAN:  Rebekah Carter. Janesha Carter, M.D.DATE OF BIRTH:  1996/01/14  DATE OF PROCEDURE:  12/02/2015 DATE OF DISCHARGE:                              OPERATIVE REPORT   SURGEON:  Rebekah Carter. Rebekah Hillock, MD  ASSISTANT:  Dimitri Ped, PA  PREOPERATIVE DIAGNOSES: 1. Spinal stenosis at L4-5. 2. Spinal stenosis at L5-S1, mainly involving the right side. 3. Partial footdrop on the right secondary to the above. 4. Herniated lumbar disk, L5-S1 on the right. 5. Herniated lumbar disk at L4-5 on the right. 6. Foraminal stenosis involving the S1 root. 7. Foraminal stenosis involving the L5 root on the right.  POSTOPERATIVE DIAGNOSES: 1. Spinal stenosis at L4-5. 2. Spinal stenosis at L5-S1, mainly involving the right side. 3. Partial footdrop on the right secondary to the above. 4. Herniated lumbar disk, L5-S1 on the right. 5. Herniated lumbar disk at L4-5 on the right. 6. Foraminal stenosis involving the S1 root. 7. Foraminal stenosis involving the L5 root on the right.  OPERATION: 1. Central decompressive lumbar laminectomy at L5-S1. 2. A partial central lumbar laminectomy at L4-L5 on the right. 3. Microdiskectomy at L4-5 on the right. 4. Microdiskectomy at L5-S1 on the right, so she had microdiskectomies     at L4-5 on the right and L5-S1 on the right. 5. Foraminotomies involving the S1 root and the L5 root on the right.  DESCRIPTION OF PROCEDURE:  Under general anesthesia, routine orthopedic prep and draping of the lower back was carried out.  She was placed on the spinal frame.  She had 2 g of IV Ancef.  At this time, the appropriate time-out was carried out.  I also marked the appropriate right side of her back in the holding area.  Following that, 2 needles were placed in the back for localization purposes.  X-ray was taken.  At this  point, incision was made down the midline.  I then separated the muscle from the lamina and spinous process bilaterally.  Another x-ray was taken with instruments on the spinous processes, then instrument in the L5-S1 space on the right.  Following that, we then went down, did a decompressive lumbar laminectomy, complete central at L5-S1.  I extended it up proximally as well.  This was extremely large and because of the marked stenosis, the dura was extremely tight and that is the reason, I elected to do the central decompression.  After that, we then 1st did a foraminotomy for the S1 root.  We identified the root, brought the microscope in, gently retracted the root, did a microdiskectomy at L5- S1.  There was a large diffuse herniated disk.  She had also a large amount of posterior longitudinal ligament that was present.  When we completed the procedure, we were easily able to move the S1 root about and the dura was completely free.  I was able to easily pass the hockey- stick out the foramina for the S1 root as well.  We then proceeded to dissect along the recess and proximally to identify the L5 root and the L4-5 space.  I  then took another x-ray with instruments in place. Following that, we noted that she had a smaller herniated disk at L4-5. The root was gently retracted.  I did a foraminotomy 1st and then did a microdiskectomy.  When we completed that, we noted that we were easily able to pass the hockey-stick out the foramina for the 5 root and the S1 root.  The dura now was completely free.  It was pulsating, initially it was not.  We then thoroughly irrigated out the area, loosely applied some thrombin-soaked Gelfoam and closed the wound layers in usual fashion.  I left a small distal deep and proximal deep part of the wound open for drainage purposes.  I initially injected 20 mL of 0.5% Marcaine and epinephrine to prevent bleeding.  Note, this lady was, her religion, she said  prevented her from taking any blood transfusions and I did not think that would be necessary anyway at this time.  We then injected at the end, 20 mL of Exparel into the soft tissue and closed this tissue in the usual fashion.  Sterile dressings were applied.          ______________________________ Rebekah Carter Rebekah Carter, M.D.     RAG/MEDQ  D:  12/02/2015  T:  12/02/2015  Job:  [id number]

## 2015-12-02 NOTE — Anesthesia Postprocedure Evaluation (Signed)
Anesthesia Post Note  Patient: Kathaleya Sultana  Procedure(s) Performed: Procedure(s) (LRB): CENTRAL DECOMPRESSION L5 - S1 AND L4 - L5 FOR SPINAL STENOSIS, MICRODISCECTOMY L4-L5, L5-S1, FORAMINOTOMIES L5 ADN S1 ROOT ON THE RIGHT (Right)  Patient location during evaluation: PACU Anesthesia Type: General Level of consciousness: sedated Pain management: satisfactory to patient Vital Signs Assessment: post-procedure vital signs reviewed and stable Respiratory status: spontaneous breathing Cardiovascular status: stable Anesthetic complications: no    Last Vitals:  Filed Vitals:   12/02/15 1716 12/02/15 1806  BP: 127/75 119/61  Pulse: 83 80  Temp: 37.1 C 36.6 C  Resp: 16 15    Last Pain:  Filed Vitals:   12/02/15 1807  PainSc: 4                  Kyley Laurel EDWARD

## 2015-12-02 NOTE — Interval H&P Note (Signed)
History and Physical Interval Note:  12/02/2015 12:02 PM  Rebekah Carter  has presented today for surgery, with the diagnosis of L5 - S1 and L4 - L5 HNP  The various methods of treatment have been discussed with the patient and family. After consideration of risks, benefits and other options for treatment, the patient has consented to  Procedure(s): CENTRAL DECOMPRESSION L5 - S1 AND L4 - L5, HEMILAMINECTOMY ON THE RIGHT 2 LEVELS (Right) as a surgical intervention .  The patient's history has been reviewed, patient examined, no change in status, stable for surgery.  I have reviewed the patient's chart and labs.  Questions were answered to the patient's satisfaction.     Markus Casten A

## 2015-12-02 NOTE — H&P (Signed)
Rebekah Carter is an 20 y.o. female.   Chief Complaint: pain in Right Leg HPI: She developed progressive pain in her Low Back and Right Leg.  Past Medical History  Diagnosis Date  . Asthma     as a child   . Anxiety   . Seizures (HCC)     last seizure 8 years ago   . Anemia     Past Surgical History  Procedure Laterality Date  . Tonsillectomy and adenoidectomy  2004    Family History  Problem Relation Age of Onset  . Diabetes Maternal Grandfather   . Hypertension Maternal Grandfather   . Diabetes Paternal Grandfather   . Hypertension Paternal Grandfather    Social History:  reports that she has never smoked. She has never used smokeless tobacco. She reports that she does not drink alcohol or use illicit drugs.  Allergies:  Allergies  Allergen Reactions  . Amoxicillin     REACTION: rash,seizures  . Other     Patient is a Scientist, product/process development   . Latex Rash  . Peanut-Containing Drug Products Itching, Swelling and Rash    Medications Prior to Admission  Medication Sig Dispense Refill  . cholecalciferol (VITAMIN D) 1000 units tablet Take 1,000 Units by mouth daily.    . IRON PO Take 2 capsules by mouth daily.    Marland Kitchen MAGNESIUM PO Take 1 tablet by mouth daily.    . mometasone (ELOCON) 0.1 % cream Apply 1 application topically daily. prn (Patient taking differently: Apply 1 application topically daily as needed (for eczema). ) 45 g 1  . Multiple Vitamin (MULTIVITAMIN WITH MINERALS) TABS tablet Take 1 tablet by mouth daily.    . cetirizine (ZYRTEC) 10 MG tablet Take 1 tablet (10 mg total) by mouth daily. (Patient taking differently: Take 10 mg by mouth daily as needed for allergies. ) 30 tablet 5    No results found for this or any previous visit (from the past 48 hour(s)). No results found.  Review of Systems  Constitutional: Negative.   HENT: Negative.   Eyes: Negative.   Respiratory: Negative.   Cardiovascular: Negative.   Gastrointestinal: Negative.    Genitourinary: Negative.   Musculoskeletal: Positive for back pain.       Right Leg Oain  Neurological: Positive for focal weakness.       Weakness of Right Foot Dorsiflexors  Endo/Heme/Allergies: Negative.   Psychiatric/Behavioral: Negative.     Blood pressure 134/74, pulse 103, temperature 98.6 F (37 C), temperature source Oral, resp. rate 18, height  (1.676 m), weight 79.039 kg (174 lb 4 oz), last menstrual period 11/11/2015, SpO2 99 %. Physical Exam  Constitutional: She appears well-developed.  HENT:  Head: Normocephalic.  Eyes: Pupils are equal, round, and reactive to light.  Neck: Normal range of motion.  Cardiovascular: Normal rate.   Respiratory: Effort normal.  GI: Soft.  Musculoskeletal:  Weakness of Right Foot Dorsiflexors.  Neurological:  Weakness of Right Foot Dorsiflexors.  Skin: Skin is warm.     Assessment/Plan Decompressive Lumbar Laminectomy and Microdiscectomies at L-4-L-5 and L-5-S-1 on the right  Rebekah Carter A, MD 12/02/2015, 11:54 AM

## 2015-12-03 ENCOUNTER — Encounter (HOSPITAL_COMMUNITY): Payer: Self-pay | Admitting: Orthopedic Surgery

## 2015-12-03 MED ORDER — METHOCARBAMOL 500 MG PO TABS: 500.0000 mg | ORAL_TABLET | Freq: Four times a day (QID) | ORAL | Status: DC | PRN

## 2015-12-03 MED ORDER — ASPIRIN EC 325 MG PO TBEC: 325.0000 mg | DELAYED_RELEASE_TABLET | Freq: Every day | ORAL | Status: DC

## 2015-12-03 MED ORDER — HYDROCODONE-ACETAMINOPHEN 5-325 MG PO TABS: 1.0000 | ORAL_TABLET | ORAL | Status: DC | PRN

## 2015-12-03 MED FILL — ASPIRIN EC 325 MG TABLET: 325 | 100 days supply | Qty: 100 | Fill #0

## 2015-12-03 MED FILL — METHOCARBAMOL 500 MG TABLET: 500 | 10 days supply | Qty: 40 | Fill #0

## 2015-12-03 MED FILL — HYDROCODON-APAP 5-325: 5-325 | 5 days supply | Qty: 60 | Fill #0

## 2015-12-03 NOTE — Evaluation (Signed)
Occupational Therapy Evaluation Patient Details Name: Rebekah Carter MRN: [id number] DOB: [birth year] Today's Date: 12/03/2015    History of Present Illness L4-5, L5-S1 decompression; pre-op pt had R foot drop   Clinical Impression   OT educated pt regarding back precautions and ADL activity . AE instruction provided    Follow Up Recommendations  No OT follow up    Equipment Recommendations  3 in 1 bedside comode       Precautions / Restrictions Precautions Precautions: Back Precaution Booklet Issued: Yes (comment) Precaution Comments: reviewed precautions with pt and her parents Restrictions Weight Bearing Restrictions: No      Mobility Bed Mobility               General bed mobility comments: pt in chair  Transfers Overall transfer level: Needs assistance Equipment used: Rolling walker (2 wheeled) Transfers: Sit to/from Stand Sit to Stand: Min guard         General transfer comment: verbal cues for hand placement    Balance Overall balance assessment: Modified Independent                                          ADL Overall ADL's : Needs assistance/impaired     Grooming: Standing;Set up   Upper Body Bathing: Set up;Sitting   Lower Body Bathing: Minimal assistance;Sit to/from stand   Upper Body Dressing : Set up;Sitting   Lower Body Dressing: Minimal assistance;Sit to/from stand;Cueing for back precautions;Adhering to back precautions;With adaptive equipment   Toilet Transfer: Min guard;RW;Grab bars   Toileting- Architect and Hygiene: Min guard;Sit to/from stand;Cueing for sequencing;Cueing for safety;Cueing for back precautions   Tub/ Shower Transfer: Min guard;Walk-in shower     General ADL Comments: Educated pt and mom in back precautions.  Provided education regarding AE.                 Pertinent Vitals/Pain Pain Assessment: 0-10 Pain Score: 3  Pain Location: back incision, no radiating  pain Pain Descriptors / Indicators: Sore Pain Intervention(s): Limited activity within patient's tolerance;Monitored during session        Extremity/Trunk Assessment Upper Extremity Assessment Upper Extremity Assessment: Generalized weakness   Lower Extremity Assessment Lower Extremity Assessment: RLE deficits/detail RLE Deficits / Details: knee extension AROM -10*, ankle DF AROM 0* (to neutral), decreased sensation to light touch R foot, ankle DF -4/5, knee ext -4/5       Communication Communication Communication: No difficulties   Cognition Arousal/Alertness: Awake/alert Behavior During Therapy: WFL for tasks assessed/performed Overall Cognitive Status: Within Functional Limits for tasks assessed                                Home Living Family/patient expects to be discharged to:: Private residence Living Arrangements: Parent Available Help at Discharge: Family;Available 24 hours/day Type of Home: House Home Access: Level entry     Home Layout: One level     Bathroom Shower/Tub: Producer, television/film/video: Standard     Home Equipment: None          Prior Functioning/Environment Level of Independence: Independent             OT Diagnosis: Generalized weakness         OT Goals(Current goals can be found in the care plan section) Acute Rehab OT Goals  Patient Stated Goal: to be able to work and go to school  OT Frequency:      End of Session Equipment Utilized During Treatment: Engineer, water Communication: Mobility status  Activity Tolerance: Patient tolerated treatment well Patient left: in chair   Time: 4098-1191 OT Time Calculation (min): 31 min Charges:  OT General Charges $OT Visit: 1 Procedure OT Evaluation $OT Eval Low Complexity: 1 Procedure OT Treatments $Self Care/Home Management : 8-22 mins G-Codes: OT G-codes **NOT FOR INPATIENT CLASS** Functional Assessment Tool Used: clinical observation Functional  Limitation: Self care Self Care Current Status (Y7829): At least 20 percent but less than 40 percent impaired, limited or restricted Self Care Goal Status (F6213): At least 1 percent but less than 20 percent impaired, limited or restricted Self Care Discharge Status [phone]): At least 20 percent but less than 40 percent impaired, limited or restricted  Rowe Warman, Metro Kung 12/03/2015, 11:49 AM

## 2015-12-03 NOTE — Discharge Instructions (Signed)
For the first three days, remove your dressing, tape a piece of saran wrap over your incision. Take your shower, then remove the saran wrap and put a clean dressing on. After three days you can shower without the saran wrap.  No driving while taking pain medications. No lifting.  Call Dr. Darrelyn Hillock if any wound complications or temperature of 101 degrees F or over.  Call the office for an appointment to see Dr. Darrelyn Hillock in two weeks: [phone] and ask for Dr. Jeannetta Ellis nurse, Mackey Birchwood.

## 2015-12-03 NOTE — Progress Notes (Signed)
[facility]   [facility] is providing the following services: RW and Commode  If patient discharges after hours, please call [phone].   Renard Hamper 12/03/2015, 12:22 PM

## 2015-12-03 NOTE — Care Management Note (Signed)
Case Management Note  Patient Details  Name: Rebekah Carter MRN: [id number] Date of Birth: 1996-08-02  Subjective/Objective:                  L4-5, L5-S1 decompression; pre-op pt had R foot drop Action/Plan: Discharge planning Expected Discharge Date:  12/03/15               Expected Discharge Plan:  Home/Self Care  In-House Referral:     Discharge planning Services  CM Consult, [facility]  Post Acute Care Choice:    Choice offered to:  Patient  DME Arranged:  3-N-1, Walker rolling DME Agency:  [place]:  NA [place] Agency:  NA  Status of Service:     Medicare Important Message Given:    Date Medicare IM Given:    Medicare IM give by:    Date Additional Medicare IM Given:    Additional Medicare Important Message give by:     If discussed at [place] of Stay Meetings, dates discussed:    Additional Comments: CM met with pt in room and gave pt [place] pamphlet and pt verbalized understanding she will go to the clinic any weekday morning from 9-10am and ask for AN APPOINTMENT FOR A PRIMARY CARE PHYSICIAN; AN APPOINTMENT WITH A NAVIGATOR TO SECURE INSURANCE; AN APPOINTMENT FOR FOLLOW UP MEDICAL CARE.  CM called [facility] DME rep, Lecretia to request charity rolling walker and 3n1.  No other CM needs were communicated. Dellie Catholic, RN 12/03/2015, 12:01 PM

## 2015-12-03 NOTE — Progress Notes (Signed)
RN reviewed discharge instructions with patient and family. All questions answered. Patient also provided with equipment from [facility].  Paperwork and prescriptions given. RN also reviewed dressing changes, and medications for pain.  NT rolled patient down in wheelchair with all belongings.

## 2015-12-03 NOTE — Evaluation (Addendum)
Physical Therapy Evaluation Patient Details Name: Rebekah Carter MRN: [id number] DOB: 1996-06-25 Today's Date: 12/03/2015   History of Present Illness  L4-5, L5-S1 decompression; pre-op pt had R foot drop  Clinical Impression  Pt is ready to DC home from PT standpoint. She ambulated 300' with RW independently, was instructed in back precautions and in home exercise program for R ankle/knee weakness. She may benefit from outpt PT if RLE weakness is still present at her f/u visit with Dr. Darrelyn Hillock.     Follow Up Recommendations      Equipment Recommendations  Rolling walker with 5" wheels;3in1 (PT)    Recommendations for Other Services OT consult     Precautions / Restrictions Precautions Precautions: Back Precaution Booklet Issued: Yes (comment) Precaution Comments: reviewed precautions with pt and her parents Restrictions Weight Bearing Restrictions: No      Mobility  Bed Mobility               General bed mobility comments: NT- up in chair, pt stated she did log roll getting out of bed with assist from nursing, reviewed log roll technique  Transfers Overall transfer level: Needs assistance Equipment used: Rolling walker (2 wheeled) Transfers: Sit to/from Stand Sit to Stand: Min guard         General transfer comment: verbal cues for hand placement  Ambulation/Gait Ambulation/Gait assistance: Modified independent (Device/Increase time) Ambulation Distance (Feet): 300 Feet Assistive device: Rolling walker (2 wheeled) Gait Pattern/deviations: Step-through pattern   Gait velocity interpretation: Below normal speed for age/gender General Gait Details: steady with RW, no R foot drop (pt had foot drop PTA), R foot "feel numb", noted pes planus R foot  Stairs            Wheelchair Mobility    Modified Rankin (Stroke Patients Only)       Balance Overall balance assessment: Modified Independent                                          Pertinent Vitals/Pain Pain Assessment: 0-10 Pain Score: 3  Pain Location: back incision, no radiating pain Pain Descriptors / Indicators: Sore Pain Intervention(s): Limited activity within patient's tolerance;Monitored during session;Premedicated before session    Home Living Family/patient expects to be discharged to:: Private residence Living Arrangements: Parent Available Help at Discharge: Family;Available 24 hours/day Type of Home: House Home Access: Level entry     Home Layout: One level Home Equipment: None      Prior Function Level of Independence: Independent               Hand Dominance        Extremity/Trunk Assessment   Upper Extremity Assessment: Overall WFL for tasks assessed           Lower Extremity Assessment: RLE deficits/detail RLE Deficits / Details: knee extension AROM -10*, ankle DF AROM 0* (to neutral), decreased sensation to light touch R foot, ankle DF -4/5, knee ext -4/5       Communication   Communication: No difficulties  Cognition Arousal/Alertness: Awake/alert Behavior During Therapy: WFL for tasks assessed/performed Overall Cognitive Status: Within Functional Limits for tasks assessed                      General Comments      Exercises General Exercises - Lower Extremity Ankle Circles/Pumps: AROM;Right;10 reps;Seated Long Arc Quad: AROM;Right;10 reps;Seated Toe  Raises: Other (comment) (demonstrated standing heel and toe raises to pt, instructions written on handout for HEP)      Assessment/Plan    PT Assessment Patent does not need any further PT services (may need outpt PT if R ankle and knee still weak at post op visit with Dr Darrelyn Hillock)  PT Diagnosis Acute pain   PT Problem List    PT Treatment Interventions     PT Goals (Current goals can be found in the Care Plan section) Acute Rehab PT Goals Patient Stated Goal: to be able to work and go to school PT Goal Formulation: All assessment and  education complete, DC therapy    Frequency     Barriers to discharge        Co-evaluation               End of Session Equipment Utilized During Treatment: Gait belt Activity Tolerance: Patient tolerated treatment well Patient left: in chair;with call bell/phone within reach;with family/visitor present Nurse Communication: Mobility status         Time: 1610-9604 PT Time Calculation (min) (ACUTE ONLY): 33 min   Charges:   PT Evaluation $PT Eval Low Complexity: 1 Procedure PT Treatments $Gait Training: 8-22 mins   PT G Codes:  PT G-Codes **NOT FOR INPATIENT CLASS** Functional Assessment Tool Used clinical judgment clinical judgment at 1101 on 12/03/15 by Alvester Morin, PT Functional Limitation Mobility: Walking and moving around Mobility: Walking and moving around at 1101 on 12/03/15 by Alvester Morin, PT Mobility: Walking and Moving Around Current Status [phone]) At least 1 percent but less than 20 percent impaired, limited or restricted CI at 1101 on 12/03/15 by Alvester Morin, PT Mobility: Walking and Moving Around Goal Status [phone]) At least 1 percent but less than 20 percent impaired, limited or restricted CI at 1101 on 12/03/15 by Alvester Morin, PT Mobility: Walking and Moving Around Discharge Status [phone]) At least 1 percent but less than 20 percent impaired, limited or restricted        Tamala Ser 12/03/2015, 11:02 AM [phone]

## 2015-12-03 NOTE — Progress Notes (Signed)
Subjective: 1 Day Post-Op Procedure(s) (LRB): CENTRAL DECOMPRESSION L5 - S1 AND L4 - L5 FOR SPINAL STENOSIS, MICRODISCECTOMY L4-L5, L5-S1, FORAMINOTOMIES L5 ADN S1 ROOT ON THE RIGHT (Right) Patient reports pain as 1 on 0-10 scale. Doing very well. No leg pain. Will DC after PT.   Objective: Vital signs in last 24 hours: Temp:  [97.4 F (36.3 C)-98.8 F (37.1 C)] 98.6 F (37 C) (02/23 0500) Pulse Rate:  [58-106] 70 (02/23 0500) Resp:  [10-20] 18 (02/23 0500) BP: (108-156)/(57-95) 113/57 mmHg (02/23 0500) SpO2:  [99 %-100 %] 100 % (02/23 0500) Weight:  [79.039 kg (174 lb 4 oz)] 79.039 kg (174 lb 4 oz) (02/22 1021)  Intake/Output from previous day: 02/22 0701 - 02/23 0700 In: 2296.7 [P.O.:420; I.V.:1876.7] Out: 3575 [Urine:3500; Blood:75] Intake/Output this shift:    No results for input(s): HGB in the last 72 hours. No results for input(s): WBC, RBC, HCT, PLT in the last 72 hours. No results for input(s): NA, K, CL, CO2, BUN, CREATININE, GLUCOSE, CALCIUM in the last 72 hours. No results for input(s): LABPT, INR in the last 72 hours.  Neurovascular intact Dorsiflexion/Plantar flexion intact  Assessment/Plan: 1 Day Post-Op Procedure(s) (LRB): CENTRAL DECOMPRESSION L5 - S1 AND L4 - L5 FOR SPINAL STENOSIS, MICRODISCECTOMY L4-L5, L5-S1, FORAMINOTOMIES L5 ADN S1 ROOT ON THE RIGHT (Right) Up with therapy Discharge home with home health  Rebekah Carter A 12/03/2015, 7:19 AM

## 2015-12-04 ENCOUNTER — Emergency Department (HOSPITAL_BASED_OUTPATIENT_CLINIC_OR_DEPARTMENT_OTHER)
Admission: EM | Admit: 2015-12-04 | Discharge: 2015-12-05 | Disposition: A | Discharge status: Home / Self Care | Payer: Self-pay | Attending: Emergency Medicine | Admitting: Emergency Medicine

## 2015-12-04 ENCOUNTER — Emergency Department (HOSPITAL_BASED_OUTPATIENT_CLINIC_OR_DEPARTMENT_OTHER): Payer: Self-pay

## 2015-12-04 ENCOUNTER — Encounter (HOSPITAL_BASED_OUTPATIENT_CLINIC_OR_DEPARTMENT_OTHER): Payer: Self-pay | Admitting: Emergency Medicine

## 2015-12-04 DIAGNOSIS — Z880 Allergy status to penicillin: Secondary | ICD-10-CM | POA: Insufficient documentation

## 2015-12-04 DIAGNOSIS — R42 Dizziness and giddiness: Secondary | ICD-10-CM | POA: Insufficient documentation

## 2015-12-04 DIAGNOSIS — J45909 Unspecified asthma, uncomplicated: Secondary | ICD-10-CM | POA: Insufficient documentation

## 2015-12-04 DIAGNOSIS — D649 Anemia, unspecified: Secondary | ICD-10-CM | POA: Insufficient documentation

## 2015-12-04 DIAGNOSIS — Z7982 Long term (current) use of aspirin: Secondary | ICD-10-CM | POA: Insufficient documentation

## 2015-12-04 DIAGNOSIS — R110 Nausea: Secondary | ICD-10-CM | POA: Insufficient documentation

## 2015-12-04 DIAGNOSIS — F419 Anxiety disorder, unspecified: Secondary | ICD-10-CM | POA: Insufficient documentation

## 2015-12-04 DIAGNOSIS — Z79899 Other long term (current) drug therapy: Secondary | ICD-10-CM | POA: Insufficient documentation

## 2015-12-04 DIAGNOSIS — Z91040 Latex allergy status: Secondary | ICD-10-CM | POA: Insufficient documentation

## 2015-12-04 DIAGNOSIS — N390 Urinary tract infection, site not specified: Secondary | ICD-10-CM | POA: Insufficient documentation

## 2015-12-04 LAB — URINALYSIS, ROUTINE W REFLEX MICROSCOPIC
Glucose, UA: NEGATIVE mg/dL
Ketones, ur: 15 mg/dL — AB
Nitrite: NEGATIVE
Protein, ur: NEGATIVE mg/dL
Specific Gravity, Urine: 1.03 (ref 1.005–1.030)
pH: 7.5 (ref 5.0–8.0)

## 2015-12-04 LAB — CBC
HCT: 34.6 % — ABNORMAL LOW (ref 36.0–46.0)
Hemoglobin: 11.6 g/dL — ABNORMAL LOW (ref 12.0–15.0)
MCH: 30.4 pg (ref 26.0–34.0)
MCHC: 33.5 g/dL (ref 30.0–36.0)
MCV: 90.6 fL (ref 78.0–100.0)
Platelets: 206 10*3/uL (ref 150–400)
RBC: 3.82 MIL/uL — ABNORMAL LOW (ref 3.87–5.11)
RDW: 12.9 % (ref 11.5–15.5)
WBC: 19.2 10*3/uL — ABNORMAL HIGH (ref 4.0–10.5)

## 2015-12-04 LAB — COMPREHENSIVE METABOLIC PANEL
ALT: 27 U/L (ref 14–54)
AST: 44 U/L — ABNORMAL HIGH (ref 15–41)
Albumin: 4.1 g/dL (ref 3.5–5.0)
Alkaline Phosphatase: 75 U/L (ref 38–126)
Anion gap: 9 (ref 5–15)
BUN: 11 mg/dL (ref 6–20)
CO2: 23 mmol/L (ref 22–32)
Calcium: 8.8 mg/dL — ABNORMAL LOW (ref 8.9–10.3)
Chloride: 100 mmol/L — ABNORMAL LOW (ref 101–111)
Creatinine, Ser: 0.67 mg/dL (ref 0.44–1.00)
GFR calc Af Amer: >60 mL/min (ref >60)
GFR calc non Af Amer: >60 mL/min (ref >60)
Glucose, Bld: 148 mg/dL — ABNORMAL HIGH (ref 65–99)
Potassium: 3.2 mmol/L — ABNORMAL LOW (ref 3.5–5.1)
Sodium: 132 mmol/L — ABNORMAL LOW (ref 135–145)
Total Bilirubin: 0.7 mg/dL (ref 0.3–1.2)
Total Protein: 7.4 g/dL (ref 6.5–8.1)

## 2015-12-04 LAB — URINE MICROSCOPIC-ADD ON

## 2015-12-04 MED ORDER — DEXTROSE 5 % IV SOLN
1.0000 g | Freq: Once | INTRAVENOUS | Status: AC
  Administered 2015-12-04: 1 g via INTRAVENOUS
  Filled 2015-12-04: qty 10

## 2015-12-04 MED ORDER — SODIUM CHLORIDE 0.9 % IV BOLUS (SEPSIS)
1000.0000 mL | Freq: Once | INTRAVENOUS | Status: AC
  Administered 2015-12-04: 1000 mL via INTRAVENOUS

## 2015-12-04 NOTE — ED Notes (Signed)
Pt states had back surgery two days ago and started having epigasric pain with low grade fever since eating beef stew for dinner

## 2015-12-04 NOTE — Discharge Summary (Signed)
Physician Discharge Summary   Patient ID: Rebekah Carter MRN: [id number] DOB/AGE: Apr 05, 1996 20 y.o.  Admit date: 12/02/2015 Discharge date: 12/03/2015  Primary Diagnosis: Lumbar disc herniation L4-L5, L5-S1 right  Admission Diagnoses:  Past Medical History  Diagnosis Date  . Asthma     as a child   . Anxiety   . Seizures ([place])     last seizure 8 years ago   . Anemia    Discharge Diagnoses:   Active Problems:   Spinal stenosis, lumbar region, with neurogenic claudication  Estimated body mass index is 28.14 kg/(m^2) as calculated from the following:   Height as of this encounter: _0  (1.676 m).   Weight as of this encounter: 79.039 kg (174 lb 4 oz).  Procedure:  Procedure(s) (LRB): CENTRAL DECOMPRESSION L5 - S1 AND L4 - L5 FOR SPINAL STENOSIS, MICRODISCECTOMY L4-L5, L5-S1, FORAMINOTOMIES L5 ADN S1 ROOT ON THE RIGHT (Right)   Consults: None  HPI: The patient presented with the chief complaint of low back pain that was progressively worsening with development of weakness and pain in the right leg. MRI showed a disc herniation at L4-L5, L5-S1 on the right.   Laboratory Data: Admission on 12/02/2015, Discharged on 12/03/2015  Component Date Value Ref Range Status  . Preg, Serum 11/25/2015 NEGATIVE  NEGATIVE Final   Comment:        THE SENSITIVITY OF THIS METHODOLOGY IS >10 mIU/mL.   Hospital Outpatient Visit on 11/25/2015  Component Date Value Ref Range Status  . WBC 11/25/2015 12.9* 4.0 - 10.5 K/uL Final  . RBC 11/25/2015 4.42  3.87 - 5.11 MIL/uL Final  . Hemoglobin 11/25/2015 13.0  12.0 - 15.0 g/dL Final  . HCT 11/25/2015 40.1  36.0 - 46.0 % Final  . MCV 11/25/2015 90.7  78.0 - 100.0 fL Final  . MCH 11/25/2015 29.4  26.0 - 34.0 pg Final  . MCHC 11/25/2015 32.4  30.0 - 36.0 g/dL Final  . RDW 11/25/2015 13.0  11.5 - 15.5 % Final  . Platelets 11/25/2015 251  150 - 400 K/uL Final  . Neutrophils Relative % 11/25/2015 71   Final  . Neutro Abs 11/25/2015 9.2* 1.7 -  7.7 K/uL Final  . Lymphocytes Relative 11/25/2015 19   Final  . Lymphs Abs 11/25/2015 2.5  0.7 - 4.0 K/uL Final  . Monocytes Relative 11/25/2015 6   Final  . Monocytes Absolute 11/25/2015 0.8  0.1 - 1.0 K/uL Final  . Eosinophils Relative 11/25/2015 4   Final  . Eosinophils Absolute 11/25/2015 0.5  0.0 - 0.7 K/uL Final  . Basophils Relative 11/25/2015 0   Final  . Basophils Absolute 11/25/2015 0.0  0.0 - 0.1 K/uL Final  . Sodium 11/25/2015 138  135 - 145 mmol/L Final  . Potassium 11/25/2015 4.1  3.5 - 5.1 mmol/L Final  . Chloride 11/25/2015 105  101 - 111 mmol/L Final  . CO2 11/25/2015 23  22 - 32 mmol/L Final  . Glucose, Bld 11/25/2015 114* 65 - 99 mg/dL Final  . BUN 11/25/2015 16  6 - 20 mg/dL Final  . Creatinine, Ser 11/25/2015 0.63  0.44 - 1.00 mg/dL Final  . Calcium 11/25/2015 10.0  8.9 - 10.3 mg/dL Final  . Total Protein 11/25/2015 8.2* 6.5 - 8.1 g/dL Final  . Albumin 11/25/2015 5.1* 3.5 - 5.0 g/dL Final  . AST 11/25/2015 20  15 - 41 U/L Final  . ALT 11/25/2015 17  14 - 54 U/L Final  . Alkaline Phosphatase 11/25/2015 83  38 - 126 U/L Final  . Total Bilirubin 11/25/2015 0.5  0.3 - 1.2 mg/dL Final  . GFR calc non Af Amer 11/25/2015 >60  >60 mL/min Final  . GFR calc Af Amer 11/25/2015 >60  >60 mL/min Final   Comment: (NOTE) The eGFR has been calculated using the CKD EPI equation. This calculation has not been validated in all clinical situations. eGFR's persistently <60 mL/min signify possible Chronic Kidney Disease.   . Anion gap 11/25/2015 10  5 - 15 Final  . Prothrombin Time 11/25/2015 13.9  11.6 - 15.2 seconds Final  . INR 11/25/2015 1.05  0.00 - 1.49 Final  . Transfuse no blood products 11/25/2015 TRANSFUSE NO BLOOD PRODUCTS, VERIFIED BY Gillian Shields, RN   Final  . MRSA, PCR 11/25/2015 NEGATIVE  NEGATIVE Final  . Staphylococcus aureus 11/25/2015 NEGATIVE  NEGATIVE Final   Comment:        The Xpert SA Assay (FDA approved for NASAL specimens in patients over 21 years of  age), is one component of a comprehensive surveillance program.  Test performance has been validated by [facility] for patients greater than or equal to 36 year old. It is not intended to diagnose infection nor to guide or monitor treatment.      X-Rays:Dg Chest 2 View  11/25/2015  CLINICAL DATA:  Preop back surgery EXAM: CHEST  2 VIEW COMPARISON:  07/21/2008 FINDINGS: Generalized levoscoliosis of the thoracolumbar spine. Heart and mediastinal contours are within normal limits. No focal opacities or effusions. No acute bony abnormality. IMPRESSION: No active cardiopulmonary disease. Electronically Signed   By: Rolm Baptise M.D.   On: 11/25/2015 14:34   Dg Lumbar Spine 2-3 Views  11/25/2015  CLINICAL DATA:  Preop for back surgery, history of scoliosis EXAM: LUMBAR SPINE - 2-3 VIEW COMPARISON:  07/05/2014 FINDINGS: Two views of lumbar spine submitted. Minimal lower lumbar dextroscoliosis. Minimal disc space flattening at L4-L5 level. Mild disc space flattening at L5-S1 level. No acute fracture or subluxation. IMPRESSION: Minimal disc space flattening at L4-L5 level. Mild disc space flattening at L5-S1 level. Electronically Signed   By: Lahoma Crocker M.D.   On: 11/25/2015 14:40   Dg Spine Portable 1 View  12/02/2015  CLINICAL DATA:  L4-5 and L5-S1 surgery. EXAM: PORTABLE SPINE - 1 VIEW COMPARISON:  Earlier today. FINDINGS: Metallic localizer with its tip projected at the posterior aspect of the L5-S1 disc space. Second metallic localizer with its tip projected posterior to the superior aspect of the L5 vertebral body. IMPRESSION: Localizers, as described above. Electronically Signed   By: Claudie Revering M.D.   On: 12/02/2015 14:18   Dg Spine Portable 1 View  12/02/2015  CLINICAL DATA:  L4-5 and L5-S1 surgery. EXAM: PORTABLE SPINE - 1 VIEW COMPARISON:  Earlier today. FINDINGS: Based on previous labeling, a metallic localizer is demonstrated with its tip projected over the facets at the L5-S1 level.  There are 2 additional localizers with their tips projected over the L4 and L5 spinous processes. IMPRESSION: Localizers, as described above. Electronically Signed   By: Claudie Revering M.D.   On: 12/02/2015 13:33   Dg Spine Portable 1 View  12/02/2015  CLINICAL DATA:  Intraoperative localization film for patient undergoing L4-5 and L5-S1 lumbar surgery. EXAM: PORTABLE SPINE - 1 VIEW COMPARISON:  Two views lumbar spine 11/25/2015. FINDINGS: Two probes are in place. The more superior is directed toward the L4-5 interspace. The more inferior is just below the level of the L5 pedicles. IMPRESSION: Localization as  above. Electronically Signed   By: Inge Rise M.D.   On: 12/02/2015 13:12    EKG: Orders placed or performed during the hospital encounter of 11/25/15  . EKG  . EKG     Hospital Course: Alesia Oshields is a 20 y.o. who was admitted to [facility]. They were brought to the operating room on 12/02/2015 and underwent Procedure(s): CENTRAL DECOMPRESSION L5 - S1 AND L4 - L5 FOR SPINAL STENOSIS, MICRODISCECTOMY L4-L5, L5-S1, FORAMINOTOMIES L5 ADN S1 ROOT ON THE RIGHT.  Patient tolerated the procedure well and was later transferred to the recovery room and then to the orthopaedic floor for postoperative care.  They were given PO and IV analgesics for pain control following their surgery.  They were given 24 hours of postoperative antibiotics of  Anti-infectives    Start     Dose/Rate Route Frequency Ordered Stop   12/02/15 2300  vancomycin (VANCOCIN) IVPB 1000 mg/200 mL premix     1,000 mg 200 mL/hr over 60 Minutes Intravenous  Once 12/02/15 1753 12/02/15 2332   12/02/15 1438  polymyxin B 500,000 Units, bacitracin 50,000 Units in sodium chloride irrigation 0.9 % 500 mL irrigation  Status:  Discontinued       As needed 12/02/15 1438 12/02/15 1450   12/02/15 1016  vancomycin (VANCOCIN) IVPB 1000 mg/200 mL premix     1,000 mg 200 mL/hr over 60 Minutes Intravenous On call to O.R.  12/02/15 1016 12/02/15 1211     and started on DVT prophylaxis in the form of Aspirin.   PT and OT were ordered.  Discharge planning consulted to help with postop disposition and equipment needs.  Patient had a fair night on the evening of surgery.  They started to get up OOB with therapy on day one. Dressing was changed on day two and the incision was clean and dry. Patient was seen in rounds and was ready to go home.   Diet: Regular diet Activity:WBAT Follow-up:in 2 weeks Disposition - Home Discharged Condition: stable   Discharge Instructions    Call MD / Call 911    Complete by:  As directed   If you experience chest pain or shortness of breath, CALL 911 and be transported to the hospital emergency room.  If you develope a fever above 101 F, pus (white drainage) or increased drainage or redness at the wound, or calf pain, call your surgeon's office.     Constipation Prevention    Complete by:  As directed   Drink plenty of fluids.  Prune juice may be helpful.  You may use a stool softener, such as Colace (over the counter) 100 mg twice a day.  Use MiraLax (over the counter) for constipation as needed.     Diet general    Complete by:  As directed      Discharge instructions    Complete by:  As directed   For the first three days, remove your dressing, tape a piece of saran wrap over your incision. Take your shower, then remove the saran wrap and put a clean dressing on. After three days you can shower without the saran wrap.  No driving while taking pain medications. No lifting.  Call Dr. Gladstone Lighter if any wound complications or temperature of 101 degrees F or over.  Call the office for an appointment to see Dr. Gladstone Lighter in two weeks: [phone] and ask for Dr. Charlestine Night nurse, Brunilda Payor.     Increase activity slowly as tolerated  Complete by:  As directed             Medication List    TAKE these medications        aspirin EC 325 MG tablet  Take 1 tablet (325 mg  total) by mouth daily. To prevent blood clots     cetirizine 10 MG tablet  Commonly known as:  ZYRTEC  Take 1 tablet (10 mg total) by mouth daily.     cholecalciferol 1000 units tablet  Commonly known as:  VITAMIN D  Take 1,000 Units by mouth daily.     HYDROcodone-acetaminophen 5-325 MG tablet  Commonly known as:  NORCO/VICODIN  Take 1-2 tablets by mouth every 4 (four) hours as needed (mild pain).     IRON PO  Take 2 capsules by mouth daily.     MAGNESIUM PO  Take 1 tablet by mouth daily.     methocarbamol 500 MG tablet  Commonly known as:  ROBAXIN  Take 1 tablet (500 mg total) by mouth every 6 (six) hours as needed for muscle spasms.     mometasone 0.1 % cream  Commonly known as:  ELOCON  Apply 1 application topically daily. prn     multivitamin with minerals Tabs tablet  Take 1 tablet by mouth daily.           Follow-up Information    Follow up with GIOFFRE,RONALD A, MD. Schedule an appointment as soon as possible for a visit in 2 weeks.   Specialty:  Orthopedic Surgery   Contact information:   [address] [place] [address] [phone]       Signed: Ardeen Jourdain, PA-C Orthopaedic Surgery 12/04/2015, 9:17 AM

## 2015-12-04 NOTE — ED Provider Notes (Addendum)
CSN: [id number]     Arrival date & time 12/04/15  2138 History  By signing my name below, I, Arianna Nassar, attest that this documentation has been prepared under the direction and in the presence of Paula Libra, MD. Electronically Signed: Octavia Heir, ED Scribe. 12/04/2015. 11:22 PM.      Chief Complaint  Patient presents with  . Abdominal Pain      The history is provided by the patient. No language interpreter was used.   HPI Comments: Rebekah Carter is a 20 y.o. female who presents to the Emergency Department complaining of moderate suprapubic abdominal pain with associated low grade fever and nausea onset this evening. Pt has a central decompression of L5-S1 and L4-L5 done by Dr. Darrelyn Hillock two days ago and did have a Foley catheter placed temporarily before discharge. She has been having increased frequency of urination. She ate solid food for the first time tonight and she began to not feel good shortly after. Pt reports she began to feel hot so mother took her temperature and it was 99.6. She notes that her lower abdomen began to start hurting and she had an episode of light-headedness. Pt is currently ambulating with a walker. She denies any other complications with her back or the surgery.   Past Medical History  Diagnosis Date  . Asthma     as a child   . Anxiety   . Seizures ([facility])     last seizure 8 years ago   . Anemia    Past Surgical History  Procedure Laterality Date  . Tonsillectomy and adenoidectomy  2004  . Lumbar laminectomy/decompression microdiscectomy Right 12/02/2015    Procedure: CENTRAL DECOMPRESSION L5 - S1 AND L4 - L5 FOR SPINAL STENOSIS, MICRODISCECTOMY L4-L5, L5-S1, FORAMINOTOMIES L5 ADN S1 ROOT ON THE RIGHT;  Surgeon: Ranee Gosselin, MD;  Location: [facility];  Service: Orthopedics;  Laterality: Right;   Family History  Problem Relation Age of Onset  . Diabetes Maternal Grandfather   . Hypertension Maternal Grandfather   . Diabetes Paternal Grandfather    . Hypertension Paternal Grandfather    Social History  Substance Use Topics  . Smoking status: Never Smoker   . Smokeless tobacco: Never Used  . Alcohol Use: No   OB History    No data available     Review of Systems  A complete 10 system review of systems was obtained and all systems are negative except as noted in the HPI and PMH.    Allergies  Amoxicillin; Other; Latex; and Peanut-containing drug products  Home Medications   Prior to Admission medications   Medication Sig Start Date End Date Taking? Authorizing Provider  HYDROcodone-acetaminophen (NORCO/VICODIN) 5-325 MG tablet Take 1-2 tablets by mouth every 4 (four) hours as needed (mild pain). 12/03/15  Yes Amber Celedonio Savage, PA-C  aspirin EC 325 MG tablet Take 1 tablet (325 mg total) by mouth daily. To prevent blood clots 12/03/15   Dimitri Ped, PA-C  cetirizine (ZYRTEC) 10 MG tablet Take 1 tablet (10 mg total) by mouth daily. Patient taking differently: Take 10 mg by mouth daily as needed for allergies.  02/11/14   Sandford Craze, NP  cholecalciferol (VITAMIN D) 1000 units tablet Take 1,000 Units by mouth daily.    Historical Provider, MD  IRON PO Take 2 capsules by mouth daily.    Historical Provider, MD  MAGNESIUM PO Take 1 tablet by mouth daily.    Historical Provider, MD  methocarbamol (ROBAXIN) 500 MG tablet Take 1 tablet (  500 mg total) by mouth every 6 (six) hours as needed for muscle spasms. 12/03/15   Amber Constable, PA-C  mometasone (ELOCON) 0.1 % cream Apply 1 application topically daily. prn Patient taking differently: Apply 1 application topically daily as needed (for eczema).  05/23/14   Sandford Craze, NP  Multiple Vitamin (MULTIVITAMIN WITH MINERALS) TABS tablet Take 1 tablet by mouth daily.    Historical Provider, MD   Triage vitals: BP 133/83 mmHg  Pulse 117  Temp(Src) 99.2 F (37.3 C) (Oral)  Resp 24  Ht 5\' 6"  (1.676 m)  Wt 174 lb (78.926 kg)  BMI 28.10 kg/m2  SpO2 100%  LMP  11/11/2015 Physical Exam  PERFORMED WITH PATIENT STANDING DUE TO SURGICAL PAIN General: Well-developed, well-nourished female in no acute distress; appearance consistent with age of record HENT: normocephalic; atraumatic Eyes: pupils equal, round and reactive to light; extraocular muscles intact Neck: supple Heart: regular rate and rhythm; no murmurs, rubs or gallops Lungs: clear to auscultation bilaterally Abdomen: soft; nondistended; no masses or hepatosplenomegaly; bowel sounds present; mild suprapubic tenderness Extremities: No deformity; full range of motion; pulses normal, bandaged vertical incision of lumbar region that is closed without erythema, warmth or drainage Neurologic: Awake, alert and oriented; motor function intact in all extremities and symmetric; no facial droop Skin: Warm and dry Psychiatric: Normal mood and affect  ED Course  Procedures  DIAGNOSTIC STUDIES: Oxygen Saturation is 100% on RA, normal by my interpretation.  COORDINATION OF CARE:  11:22 PM Discussed treatment plan which includes x-ray of abdomen and pain medication with pt at bedside and pt agreed to plan.   MDM   Nursing notes and vitals signs, including pulse oximetry, reviewed.  Summary of this visit's results, reviewed by myself:  Labs:  Results for orders placed or performed during the hospital encounter of 12/04/15 (from the past 24 hour(s))  Comprehensive metabolic panel     Status: Abnormal   Collection Time: 12/04/15 10:40 PM  Result Value Ref Range   Sodium 132 (L) 135 - 145 mmol/L   Potassium 3.2 (L) 3.5 - 5.1 mmol/L   Chloride 100 (L) 101 - 111 mmol/L   CO2 23 22 - 32 mmol/L   Glucose, Bld 148 (H) 65 - 99 mg/dL   BUN 11 6 - 20 mg/dL   Creatinine, Ser 1.61 0.44 - 1.00 mg/dL   Calcium 8.8 (L) 8.9 - 10.3 mg/dL   Total Protein 7.4 6.5 - 8.1 g/dL   Albumin 4.1 3.5 - 5.0 g/dL   AST 44 (H) 15 - 41 U/L   ALT 27 14 - 54 U/L   Alkaline Phosphatase 75 38 - 126 U/L   Total Bilirubin 0.7  0.3 - 1.2 mg/dL   GFR calc non Af Amer >60 >60 mL/min   GFR calc Af Amer >60 >60 mL/min   Anion gap 9 5 - 15  CBC     Status: Abnormal   Collection Time: 12/04/15 10:40 PM  Result Value Ref Range   WBC 19.2 (H) 4.0 - 10.5 K/uL   RBC 3.82 (L) 3.87 - 5.11 MIL/uL   Hemoglobin 11.6 (L) 12.0 - 15.0 g/dL   HCT 09.6 (L) 04.5 - 40.9 %   MCV 90.6 78.0 - 100.0 fL   MCH 30.4 26.0 - 34.0 pg   MCHC 33.5 30.0 - 36.0 g/dL   RDW 81.1 91.4 - 78.2 %   Platelets 206 150 - 400 K/uL  Urinalysis, Routine w reflex microscopic (not at [facility])  Status: Abnormal   Collection Time: 12/04/15 10:53 PM  Result Value Ref Range   Color, Urine AMBER (A) YELLOW   APPearance CLOUDY (A) CLEAR   Specific Gravity, Urine 1.030 1.005 - 1.030   pH 7.5 5.0 - 8.0   Glucose, UA NEGATIVE NEGATIVE mg/dL   Hgb urine dipstick MODERATE (A) NEGATIVE   Bilirubin Urine SMALL (A) NEGATIVE   Ketones, ur 15 (A) NEGATIVE mg/dL   Protein, ur NEGATIVE NEGATIVE mg/dL   Nitrite NEGATIVE NEGATIVE   Leukocytes, UA MODERATE (A) NEGATIVE  Urine microscopic-add on     Status: Abnormal   Collection Time: 12/04/15 10:53 PM  Result Value Ref Range   Squamous Epithelial / LPF 6-30 (A) NONE SEEN   WBC, UA 6-30 0 - 5 WBC/hpf   RBC / HPF 0-5 0 - 5 RBC/hpf   Bacteria, UA FEW (A) NONE SEEN    Imaging Studies: Dg Abd 1 View  12/05/2015  CLINICAL DATA:  Suprapubic abdominal pain. Fever and nausea at this evening. Post lumbar surgery 2 days prior. EXAM: ABDOMEN - 1 VIEW COMPARISON:  None. FINDINGS: The bowel gas pattern is normal. Moderate stool burden. No dilated bowel loops. No radio-opaque calculi. Postsurgical change in the lumbar spine post laminectomy with skin staples. IMPRESSION: Normal bowel gas pattern. Electronically Signed   By: Rubye Oaks M.D.   On: 12/05/2015 00:35   12:46 AM Patient given IV fluid bolus and IV Rocephin for urinary tract infection.   Paula Libra, MD 12/05/15 1610  Paula Libra, MD 12/05/15 [phone]

## 2015-12-05 ENCOUNTER — Telehealth: Payer: Self-pay | Admitting: Family

## 2015-12-05 MED ORDER — NITROFURANTOIN MONOHYD MACRO 100 MG PO CAPS
100.0000 mg | ORAL_CAPSULE | Freq: Once | ORAL | Status: AC
  Administered 2015-12-05: 100 mg via ORAL
  Filled 2015-12-05: qty 1

## 2015-12-05 MED ORDER — NITROFURANTOIN MONOHYD MACRO 100 MG PO CAPS: 100.0000 mg | ORAL_CAPSULE | Freq: Two times a day (BID) | ORAL | Status: DC

## 2015-12-05 NOTE — Telephone Encounter (Signed)
Please call pt to arrange ED follow up with me this week in office.  OK to put in 15 min slot if no 30 min available.

## 2015-12-06 LAB — URINE CULTURE: Culture: 30000

## 2015-12-07 NOTE — Telephone Encounter (Signed)
Recorded msg "the person you are calling cannot accept calls at this time"

## 2015-12-07 NOTE — Telephone Encounter (Signed)
LM for pt to call and schedule ER follow up (30 min or 15 min)

## 2015-12-08 NOTE — Telephone Encounter (Signed)
Pt called back in. Scheduled pt for ED fu. Pt says that she cant come in any sooner than 2 weeks. Scheduled pt on her first available.

## 2015-12-29 ENCOUNTER — Ambulatory Visit: Payer: Self-pay | Admitting: Family

## 2016-06-17 ENCOUNTER — Ambulatory Visit: Payer: Self-pay | Admitting: Family

## 2017-12-08 IMAGING — CR DG LUMBAR SPINE 2-3V
2 series · 2 of 2 positions shown · non-contrast
Comparison: 07/05/2014

CLINICAL DATA: Preop for back surgery, history of scoliosis

EXAM:
LUMBAR SPINE - 2-3 VIEW

[t l-spine a.p.]
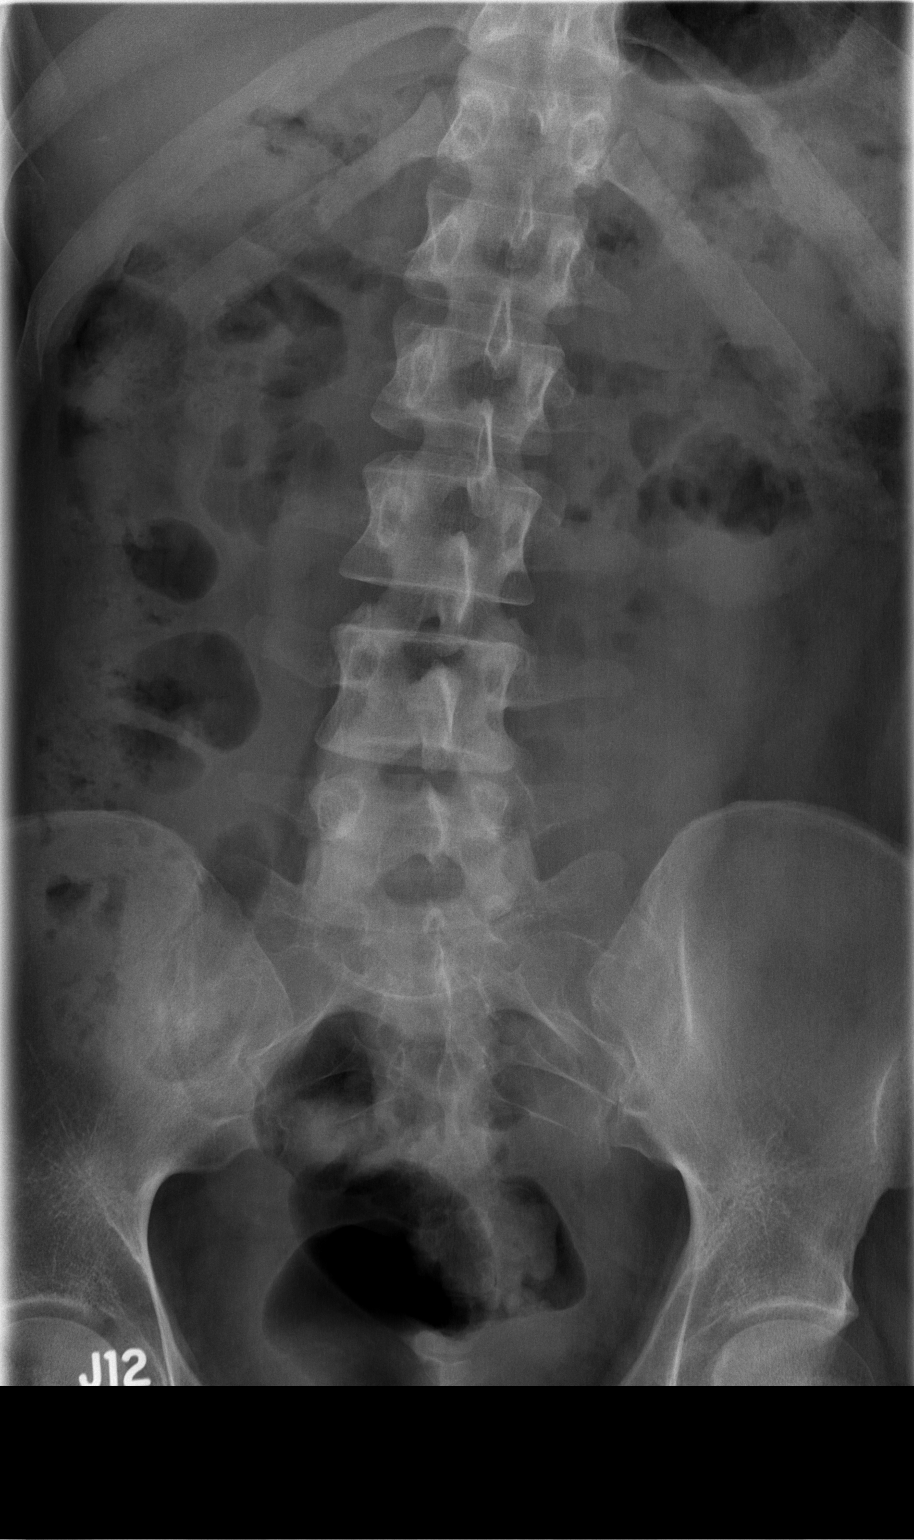

[t l-spine lat]
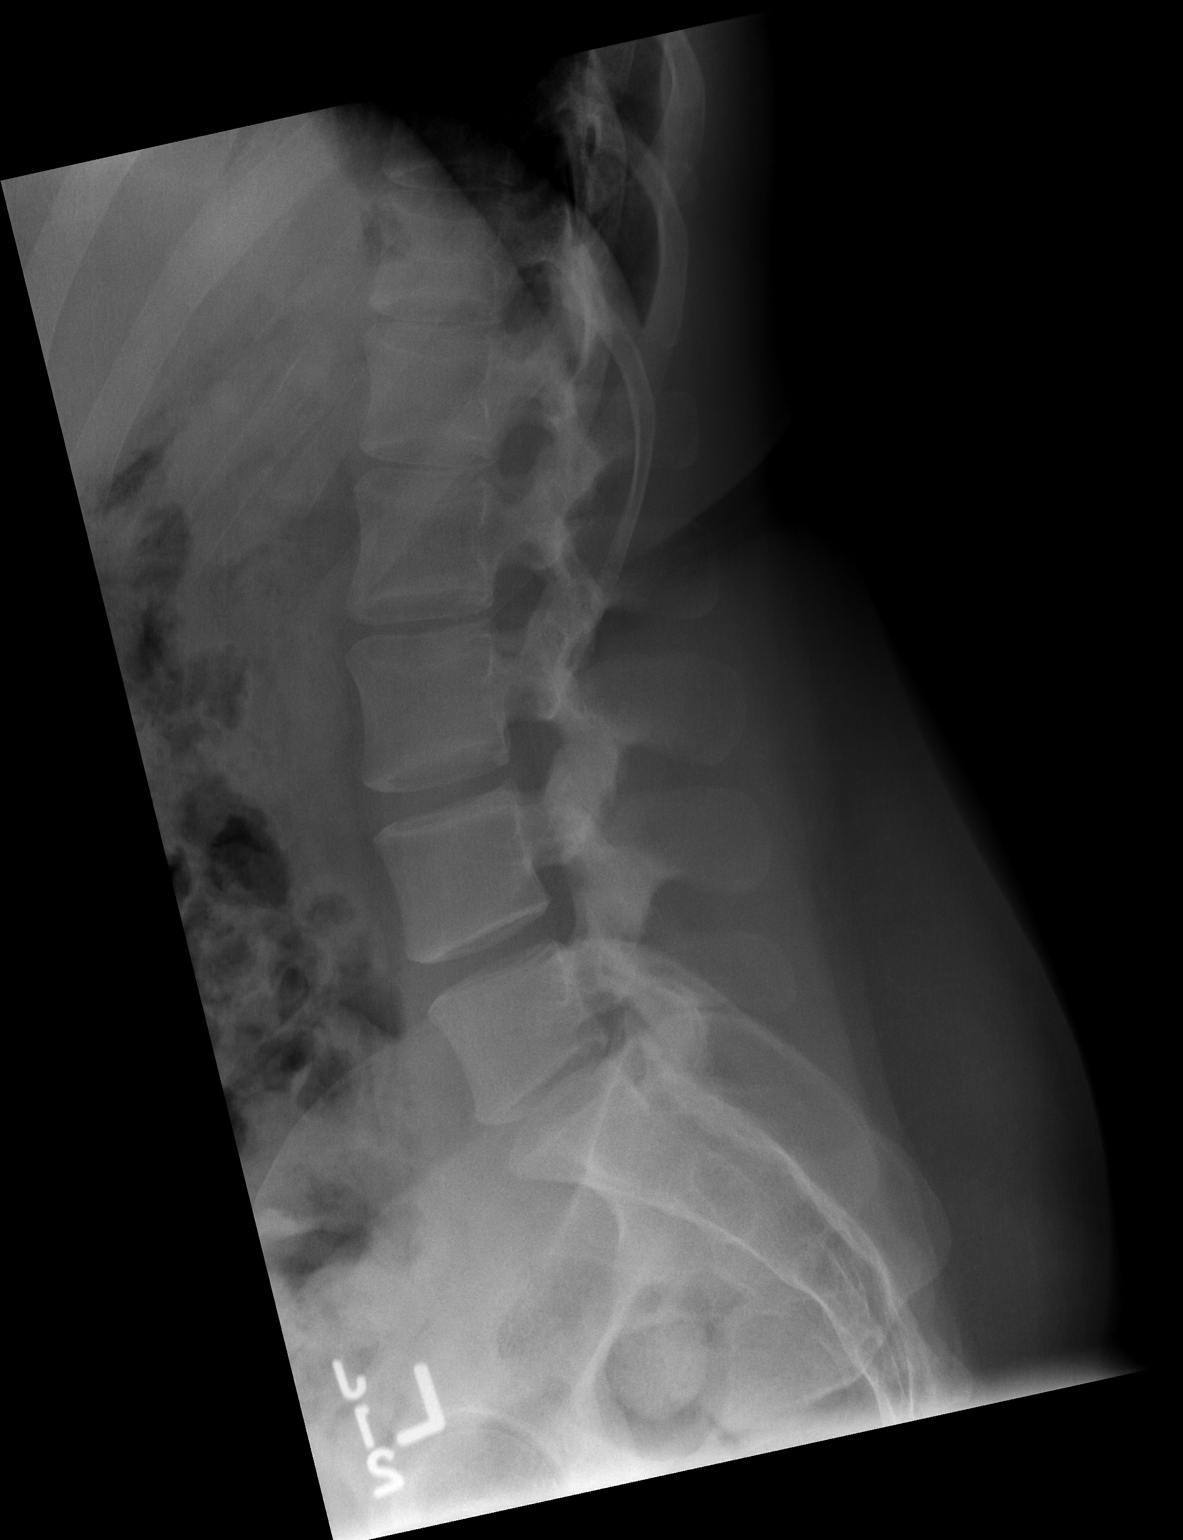

[2 of 2 positions shown; findings below may reference images not displayed]

FINDINGS: Two views of lumbar spine submitted. Minimal lower lumbar
dextroscoliosis. Minimal disc space flattening at L4-L5 level. Mild
disc space flattening at L5-S1 level. No acute fracture or
subluxation.
IMPRESSION: Minimal disc space flattening at L4-L5 level. Mild disc space
flattening at L5-S1 level.

## 2017-12-17 IMAGING — CR DG ABDOMEN 1V
2 series · 2 of 2 positions shown · non-contrast
Comparison: None.

CLINICAL DATA: Suprapubic abdominal pain. Fever and nausea at this
evening. Post lumbar surgery 2 days prior.

EXAM:
ABDOMEN - 1 VIEW

[t abdomen supine (1 of 2)]
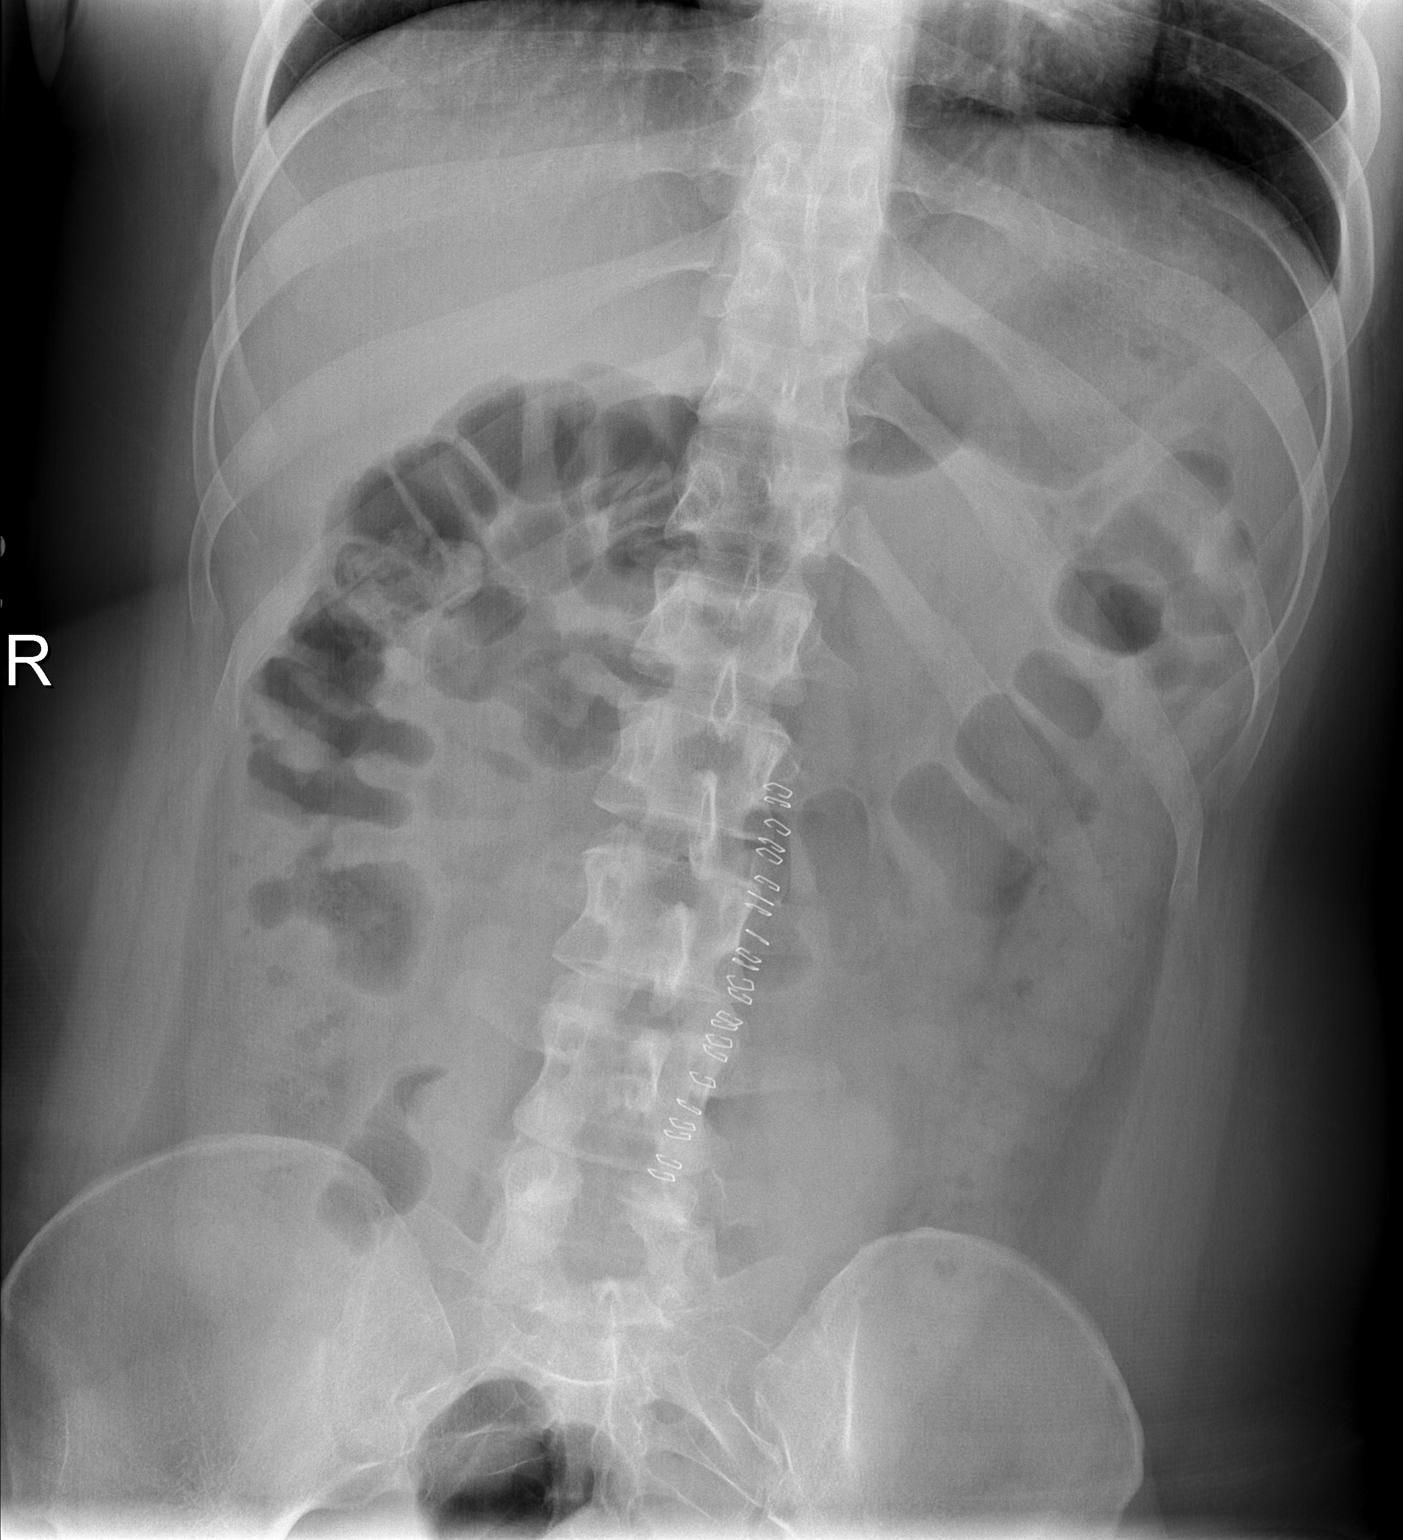

[t abdomen supine (2 of 2)]
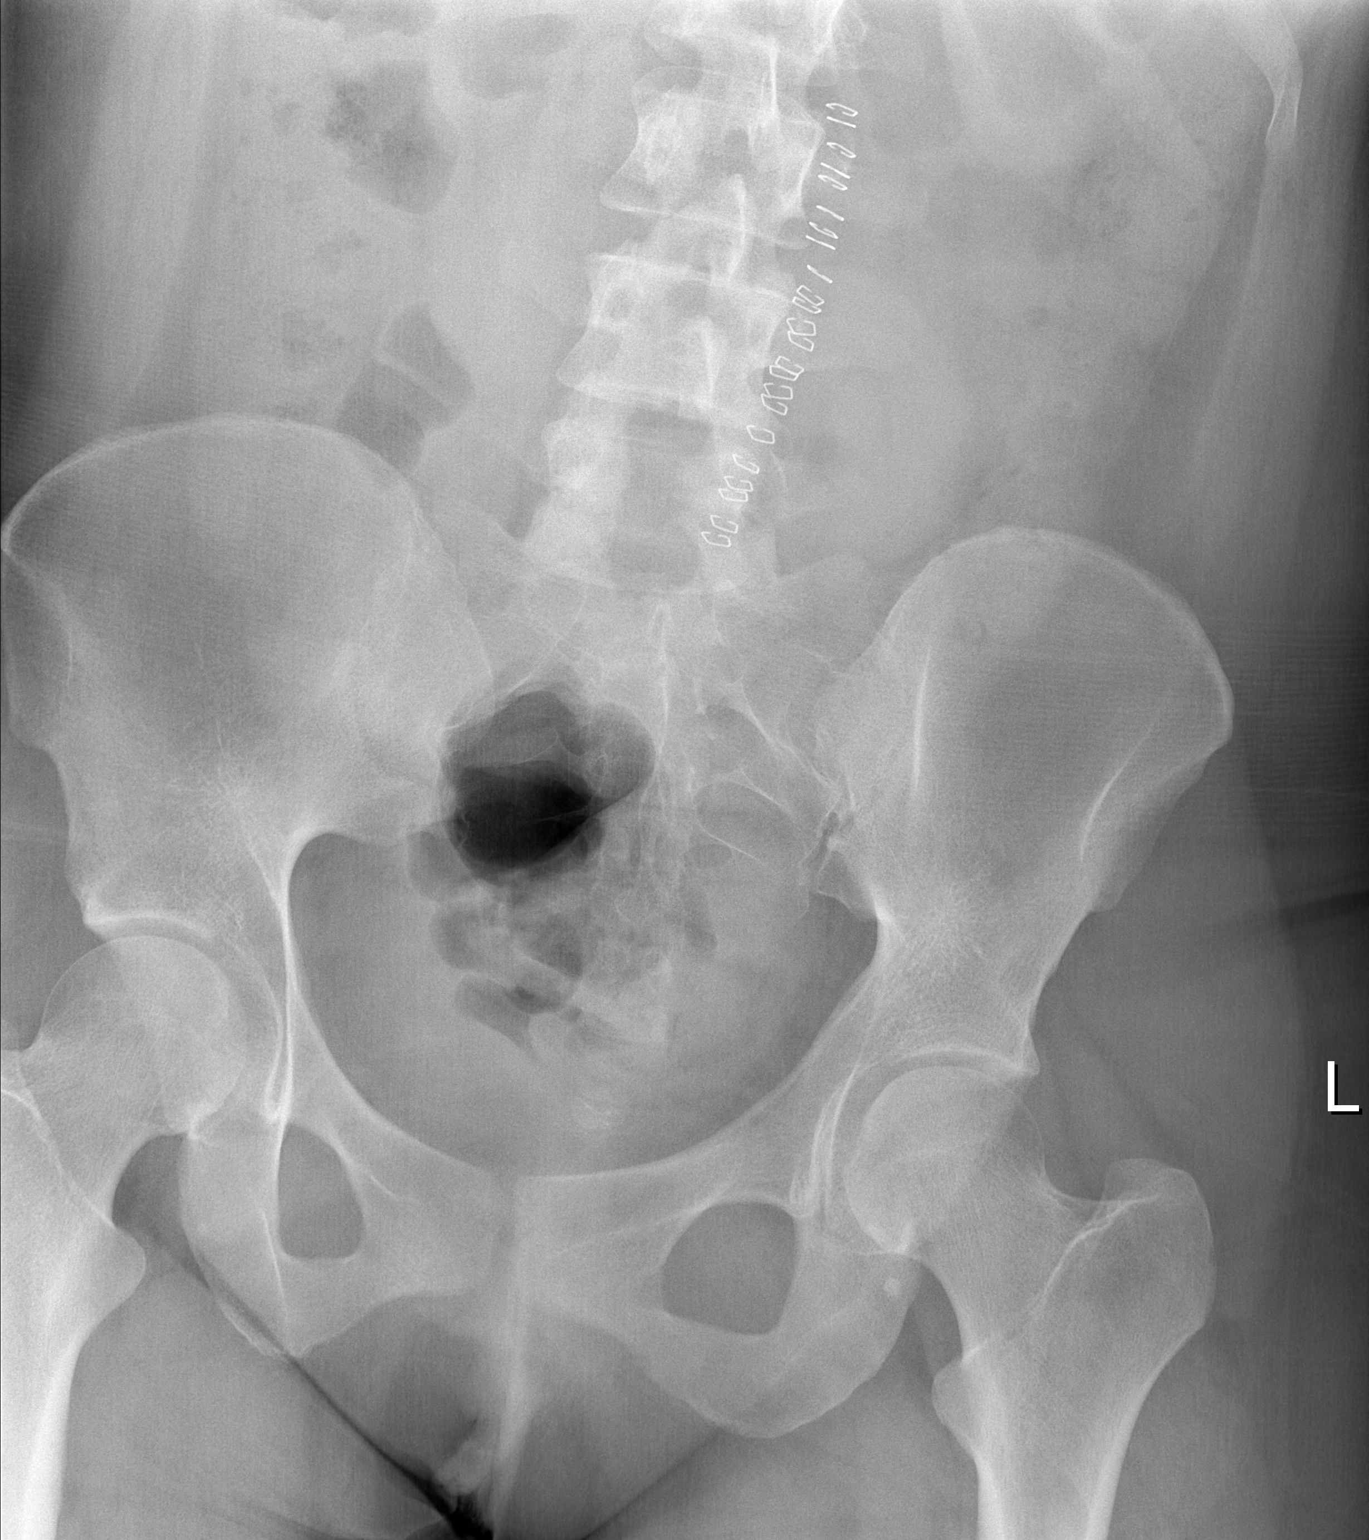

[2 of 2 positions shown; findings below may reference images not displayed]

FINDINGS: The bowel gas pattern is normal. Moderate stool burden. No dilated
bowel loops. No radio-opaque calculi. Postsurgical change in the
lumbar spine post laminectomy with skin staples.
IMPRESSION: Normal bowel gas pattern.

## 2019-10-23 ENCOUNTER — Telehealth: Payer: Self-pay | Admitting: Family

## 2019-10-23 NOTE — Telephone Encounter (Signed)
Copied from CRM [id number]. Topic: Appointment Scheduling - Scheduling Inquiry for Clinic >> Oct 23, 2019  2:36 PM Fanny Bien wrote: Reason for CRM: pt called and stated that she has not been seen since 2016 and would like to know if she can schedule a physical with Sandford Craze. Please advise

## 2019-11-22 ENCOUNTER — Encounter: Payer: Self-pay | Admitting: Family

## 2019-12-17 ENCOUNTER — Ambulatory Visit (INDEPENDENT_AMBULATORY_CARE_PROVIDER_SITE_OTHER): Payer: Self-pay | Admitting: Family

## 2019-12-17 ENCOUNTER — Other Ambulatory Visit (HOSPITAL_COMMUNITY)
Admission: RE | Admit: 2019-12-17 | Discharge: 2019-12-17 | Disposition: A | Discharge status: Home / Self Care | Payer: Medicaid Other | Source: Ambulatory Visit | Attending: Family | Admitting: Family

## 2019-12-17 ENCOUNTER — Other Ambulatory Visit: Payer: Self-pay

## 2019-12-17 ENCOUNTER — Encounter: Payer: Self-pay | Admitting: Family

## 2019-12-17 VITALS — BP 123/69 | HR 76 | Temp 97.9°F | Resp 16 | Ht 66.0 in | Wt 176.0 lb

## 2019-12-17 DIAGNOSIS — Z531 Procedure and treatment not carried out because of patient's decision for reasons of belief and group pressure: Secondary | ICD-10-CM

## 2019-12-17 DIAGNOSIS — Z01419 Encounter for gynecological examination (general) (routine) without abnormal findings: Secondary | ICD-10-CM | POA: Diagnosis not present

## 2019-12-17 DIAGNOSIS — N912 Amenorrhea, unspecified: Secondary | ICD-10-CM

## 2019-12-17 DIAGNOSIS — Z114 Encounter for screening for human immunodeficiency virus [HIV]: Secondary | ICD-10-CM

## 2019-12-17 DIAGNOSIS — Z0000 Encounter for general adult medical examination without abnormal findings: Secondary | ICD-10-CM

## 2019-12-17 DIAGNOSIS — IMO0001 Reserved for inherently not codable concepts without codable children: Secondary | ICD-10-CM

## 2019-12-17 HISTORY — DX: Procedure and treatment not carried out because of patient's decision for reasons of belief and group pressure: Z53.1

## 2019-12-17 HISTORY — DX: Reserved for inherently not codable concepts without codable children: IMO0001

## 2019-12-17 LAB — CBC WITH DIFFERENTIAL/PLATELET
Basophils Absolute: 0 10*3/uL (ref 0.0–0.1)
Basophils Relative: 0.7 % (ref 0.0–3.0)
Eosinophils Absolute: 0.3 10*3/uL (ref 0.0–0.7)
Eosinophils Relative: 4.4 % (ref 0.0–5.0)
HCT: 33.3 % — ABNORMAL LOW (ref 36.0–46.0)
Hemoglobin: 10.5 g/dL — ABNORMAL LOW (ref 12.0–15.0)
Lymphocytes Relative: 25.7 % (ref 12.0–46.0)
Lymphs Abs: 1.8 10*3/uL (ref 0.7–4.0)
MCHC: 31.5 g/dL (ref 30.0–36.0)
MCV: 74 fl — ABNORMAL LOW (ref 78.0–100.0)
Monocytes Absolute: 0.5 10*3/uL (ref 0.1–1.0)
Monocytes Relative: 7.7 % (ref 3.0–12.0)
Neutro Abs: 4.2 10*3/uL (ref 1.4–7.7)
Neutrophils Relative %: 61.5 % (ref 43.0–77.0)
Platelets: 240 10*3/uL (ref 150.0–400.0)
RBC: 4.5 Mil/uL (ref 3.87–5.11)
RDW: 19.1 % — ABNORMAL HIGH (ref 11.5–15.5)
WBC: 6.9 10*3/uL (ref 4.0–10.5)

## 2019-12-17 LAB — HEPATIC FUNCTION PANEL
ALT: 12 U/L (ref 0–35)
AST: 15 U/L (ref 0–37)
Albumin: 4.1 g/dL (ref 3.5–5.2)
Alkaline Phosphatase: 76 U/L (ref 39–117)
Bilirubin, Direct: 0.1 mg/dL (ref 0.0–0.3)
Total Bilirubin: 0.4 mg/dL (ref 0.2–1.2)
Total Protein: 7.1 g/dL (ref 6.0–8.3)

## 2019-12-17 LAB — BASIC METABOLIC PANEL
BUN: 9 mg/dL (ref 6–23)
CO2: 25 mEq/L (ref 19–32)
Calcium: 9 mg/dL (ref 8.4–10.5)
Chloride: 105 mEq/L (ref 96–112)
Creatinine, Ser: 0.62 mg/dL (ref 0.40–1.20)
GFR: 116.95 mL/min (ref >60.00)
Glucose, Bld: 85 mg/dL (ref 70–99)
Potassium: 3.8 mEq/L (ref 3.5–5.1)
Sodium: 134 mEq/L — ABNORMAL LOW (ref 135–145)

## 2019-12-17 LAB — LIPID PANEL
Cholesterol: 137 mg/dL (ref 0–200)
HDL: 37.1 mg/dL — ABNORMAL LOW (ref >39.00)
LDL Cholesterol: 83 mg/dL (ref 0–99)
NonHDL: 99.65
Total CHOL/HDL Ratio: 4
Triglycerides: 81 mg/dL (ref 0.0–149.0)
VLDL: 16.2 mg/dL (ref 0.0–40.0)

## 2019-12-17 LAB — POCT URINE PREGNANCY: Preg Test, Ur: NEGATIVE

## 2019-12-17 LAB — TSH: TSH: 1.09 u[IU]/mL (ref 0.35–4.50)

## 2019-12-17 MED ORDER — PRENATAL VITAMIN 27-0.8 MG PO TABS: 1.0000 | ORAL_TABLET | Freq: Every day | ORAL | Status: DC

## 2019-12-17 NOTE — Progress Notes (Signed)
Subjective:    Patient ID: Rebekah Carter, female    DOB: 1995/11/30, 24 y.o.   MRN: [id number]  HPI  Pt is a 24 yr old female who presents today to re-establish care.    Patient presents today for complete physical.  Immunizations: Tetanus 2014, thinking about covid vaccine- thinking about it Diet: eating healthy Exercise: walks mainly Wt Readings from Last 3 Encounters:  12/17/19 176 lb (79.8 kg)  12/04/15 174 lb (78.9 kg)  12/02/15 174 lb 4 oz (79 kg)  Pap Smear: has never had pap smear Vision: 2019 Dental: 2019  Anxiety- Reports that anxiety has been under care of a psychiatrist.  She reports that the zoloft seems to be helping.   [facility] [employer] [facility] Dr. Rushie Nyhan in December for some low back pain.  She was treated with naproxen.  She is not currently working.  Sh eis currently uninsured.    LMP 1/21, in January she wast treated for gonorrhea.    Review of Systems  Constitutional: Negative for unexpected weight change.  HENT: Negative for hearing loss and rhinorrhea (mild allergy symptoms).   Eyes: Negative for visual disturbance.  Respiratory: Negative for cough and shortness of breath.   Cardiovascular: Negative for chest pain.  Gastrointestinal: Negative for constipation and diarrhea.  Genitourinary: Positive for menstrual problem. Negative for dysuria and frequency.  Musculoskeletal: Positive for back pain (occasional).  Skin: Negative for rash.  Neurological: Negative for headaches.  Hematological: Negative for adenopathy.  Psychiatric/Behavioral:       Denies depression   Past Medical History:  Diagnosis Date  . Anemia   . Anxiety   . Asthma    as a child   . Seizures ([place])    last seizure 8 years ago      Social History   Socioeconomic History  . Marital status: Single    Spouse name: Not on file  . Number of children: Not on file  . Years of education: Not on file  . Highest education level: Not on file  Occupational History  .  Not on file  Tobacco Use  . Smoking status: Never Smoker  . Smokeless tobacco: Never Used  Substance and Sexual Activity  . Alcohol use: No  . Drug use: No  . Sexual activity: Never  Other Topics Concern  . 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         Social Determinants of Health   Financial Resource Strain:   . Difficulty of Paying Living Expenses: Not on file  Food Insecurity:   . Worried About Charity fundraiser in the Last Year: Not on file  . Ran Out of Food in the Last Year: Not on file  Transportation Needs:   . Lack of Transportation (Medical): Not on file  . Lack of Transportation (Non-Medical): Not on file  Physical Activity:   . Days of Exercise per Week: Not on file  . Minutes of Exercise per Session: Not on file  Stress:   . Feeling of Stress : Not on file  Social Connections:   . Frequency of Communication with Friends and Family: Not on file  . Frequency of Social Gatherings with Friends and Family: Not on file  . Attends Religious Services: Not on file  . Active Member of Clubs or Organizations: Not on file  .  Attends Banker Meetings: Not on file  . Marital Status: Not on file  Intimate Partner Violence:   . Fear of Current or Ex-Partner: Not on file  . Emotionally Abused: Not on file  . Physically Abused: Not on file  . Sexually Abused: Not on file    Past Surgical History:  Procedure Laterality Date  . LUMBAR LAMINECTOMY/DECOMPRESSION MICRODISCECTOMY Right 12/02/2015   Procedure: CENTRAL DECOMPRESSION L5 - S1 AND L4 - L5 FOR SPINAL STENOSIS, MICRODISCECTOMY L4-L5, L5-S1, FORAMINOTOMIES L5 ADN S1 ROOT ON THE RIGHT;  Surgeon: Ranee Gosselin, MD;  Location: [facility];  Service: Orthopedics;  Laterality: Right;  . TONSILLECTOMY AND ADENOIDECTOMY  2004    Family History  Problem Relation Age of Onset  . Diabetes Maternal Grandfather   .  Hypertension Maternal Grandfather   . Diabetes Paternal Grandfather   . Hypertension Paternal Grandfather     Allergies  Allergen Reactions  . Amoxicillin     REACTION: rash,seizures  . Other     Patient is a Scientist, product/process development   . Latex Rash  . Peanut-Containing Drug Products Itching, Swelling and Rash    Current Outpatient Medications on File Prior to Visit  Medication Sig Dispense Refill  . sertraline (ZOLOFT) 25 MG tablet Take 25 mg by mouth at bedtime.     No current facility-administered medications on file prior to visit.    BP 123/69 (BP Location: Left Arm, Patient Position: Sitting, Cuff Size: Small)   Pulse 76   Temp 97.9 F (36.6 C) (Temporal)   Resp 16   Ht 5\' 6"  (1.676 m)   Wt 176 lb (79.8 kg)   LMP 10/13/2019   SpO2 100%   BMI 28.41 kg/m       Objective:   Physical Exam  Physical Exam  Constitutional: She is oriented to person, place, and time. She appears well-developed and well-nourished. No distress.  HENT:  Head: Normocephalic and atraumatic.  Right Ear: Tympanic membrane and ear canal normal.  Left Ear: Tympanic membrane and ear canal normal.  Mouth/Throat:Not examined- pt wearing mask Eyes: No scleral icterus.  Neck: Normal range of motion. No thyromegaly present.  Cardiovascular: Normal rate and regular rhythm.   No murmur heard. Pulmonary/Chest: Effort normal and breath sounds normal. No respiratory distress. He has no wheezes. She has no rales. She exhibits no tenderness.  Abdominal: Soft. Bowel sounds are normal. She exhibits no distension and no mass. There is no tenderness. There is no rebound and no guarding.  Musculoskeletal: She exhibits no edema.  Lymphadenopathy:    She has no cervical adenopathy.  Neurological: She is alert and oriented to person, place, and time. She has normal patellar reflexes. She exhibits normal muscle tone. Coordination normal. 3+ bilateral patellar reflexes Skin: Skin is warm and dry.  Psychiatric: She has  a normal mood and affect. Her behavior is normal. Judgment and thought content normal.  Breasts: Examined lying Right: Without masses, retractions, discharge or axillary adenopathy.  Left: Without masses, retractions, discharge or axillary adenopathy.  Inguinal/mons: Normal without inguinal adenopathy  External genitalia: Normal  BUS/Urethra/Skene's glands: Normal  Bladder: Normal  Vagina: Normal - some light yellow discharge is noted Cervix: Normal  Uterus: normal in size, shape and contour. Midline and mobile  Adnexa/parametria:  Rt: Without masses or tenderness.  Lt: Without masses or tenderness.  Anus and perineum: Normal            Assessment & Plan:   Preventative care- discussed healthy diet, exercise,  weight loss- goal BMI 25 or less. Obtain routine lab work. Pap performed today. Will screen for   Amenorrhea- hcg negative today. She desires pregnancy- recommended that she add a prenatal vitamin once daily. She will let me know if she still hasn't gotten menstrual cycle in 1 week.    This visit occurred during the SARS-CoV-2 public health emergency.  Safety protocols were in place, including screening questions prior to the visit, additional usage of staff PPE, and extensive cleaning of exam room while observing appropriate contact time as indicated for disinfecting solutions.         Assessment & Plan:

## 2019-12-17 NOTE — Patient Instructions (Addendum)
Please complete lab work prior to leaving.  Begin a pre-natal vitamin once daily.   Preventive Care 49-26 Years Old, Female Preventive care refers to visits with your health care provider and lifestyle choices that can promote health and wellness. This includes:  A yearly physical exam. This may also be called an annual well check.  Regular dental visits and eye exams.  Immunizations.  Screening for certain conditions.  Healthy lifestyle choices, such as eating a healthy diet, getting regular exercise, not using drugs or products that contain nicotine and tobacco, and limiting alcohol use. What can I expect for my preventive care visit? Physical exam Your health care provider will check your:  Height and weight. This may be used to calculate body mass index (BMI), which tells if you are at a healthy weight.  Heart rate and blood pressure.  Skin for abnormal spots. Counseling Your health care provider may ask you questions about your:  Alcohol, tobacco, and drug use.  Emotional well-being.  Home and relationship well-being.  Sexual activity.  Eating habits.  Work and work Statistician.  Method of birth control.  Menstrual cycle.  Pregnancy history. What immunizations do I need?  Influenza (flu) vaccine  This is recommended every year. Tetanus, diphtheria, and pertussis (Tdap) vaccine  You may need a Td booster every 10 years. Varicella (chickenpox) vaccine  You may need this if you have not been vaccinated. Human papillomavirus (HPV) vaccine  If recommended by your health care provider, you may need three doses over 6 months. Measles, mumps, and rubella (MMR) vaccine  You may need at least one dose of MMR. You may also need a second dose. Meningococcal conjugate (MenACWY) vaccine  One dose is recommended if you are age 26-21 years and a first-year college student living in a residence hall, or if you have one of several medical conditions. You may also  need additional booster doses. Pneumococcal conjugate (PCV13) vaccine  You may need this if you have certain conditions and were not previously vaccinated. Pneumococcal polysaccharide (PPSV23) vaccine  You may need one or two doses if you smoke cigarettes or if you have certain conditions. Hepatitis A vaccine  You may need this if you have certain conditions or if you travel or work in places where you may be exposed to hepatitis A. Hepatitis B vaccine  You may need this if you have certain conditions or if you travel or work in places where you may be exposed to hepatitis B. Haemophilus influenzae type b (Hib) vaccine  You may need this if you have certain conditions. You may receive vaccines as individual doses or as more than one vaccine together in one shot (combination vaccines). Talk with your health care provider about the risks and benefits of combination vaccines. What tests do I need?  Blood tests  Lipid and cholesterol levels. These may be checked every 5 years starting at age 65.  Hepatitis C test.  Hepatitis B test. Screening  Diabetes screening. This is done by checking your blood sugar (glucose) after you have not eaten for a while (fasting).  Sexually transmitted disease (STD) testing.  BRCA-related cancer screening. This may be done if you have a family history of breast, ovarian, tubal, or peritoneal cancers.  Pelvic exam and Pap test. This may be done every 3 years starting at age 62. Starting at age 59, this may be done every 5 years if you have a Pap test in combination with an HPV test. Talk with your health  care provider about your test results, treatment options, and if necessary, the need for more tests. Follow these instructions at home: Eating and drinking   Eat a diet that includes fresh fruits and vegetables, whole grains, lean protein, and low-fat dairy.  Take vitamin and mineral supplements as recommended by your health care provider.  Do not  drink alcohol if: ? Your health care provider tells you not to drink. ? You are pregnant, may be pregnant, or are planning to become pregnant.  If you drink alcohol: ? Limit how much you have to 0-1 drink a day. ? Be aware of how much alcohol is in your drink. In the [place] one drink equals one 12 oz bottle of beer (355 mL), one 5 oz glass of wine (148 mL), or one 1 oz glass of hard liquor (44 mL). Lifestyle  Take daily care of your teeth and gums.  Stay active. Exercise for at least 30 minutes on 5 or more days each week.  Do not use any products that contain nicotine or tobacco, such as cigarettes, e-cigarettes, and chewing tobacco. If you need help quitting, ask your health care provider.  If you are sexually active, practice safe sex. Use a condom or other form of birth control (contraception) in order to prevent pregnancy and STIs (sexually transmitted infections). If you plan to become pregnant, see your health care provider for a preconception visit. What's next?  Visit your health care provider once a year for a well check visit.  Ask your health care provider how often you should have your eyes and teeth checked.  Stay up to date on all vaccines. This information is not intended to replace advice given to you by your health care provider. Make sure you discuss any questions you have with your health care provider. Document Revised: 06/07/2018 Document Reviewed: 06/07/2018 Elsevier Patient Education  2020 [employer].

## 2019-12-18 ENCOUNTER — Other Ambulatory Visit (INDEPENDENT_AMBULATORY_CARE_PROVIDER_SITE_OTHER): Payer: Self-pay

## 2019-12-18 ENCOUNTER — Other Ambulatory Visit: Payer: Self-pay | Admitting: Emergency Medicine

## 2019-12-18 ENCOUNTER — Encounter: Payer: Self-pay | Admitting: Family

## 2019-12-18 DIAGNOSIS — D509 Iron deficiency anemia, unspecified: Secondary | ICD-10-CM

## 2019-12-18 LAB — CERVICOVAGINAL ANCILLARY ONLY
Chlamydia: NEGATIVE
Comment: NEGATIVE
Comment: NORMAL
Neisseria Gonorrhea: NEGATIVE

## 2019-12-18 LAB — HIV ANTIBODY (ROUTINE TESTING W REFLEX): HIV 1&2 Ab, 4th Generation: NONREACTIVE

## 2019-12-18 LAB — FERRITIN: Ferritin: 6.7 ng/mL — ABNORMAL LOW (ref 10.0–291.0)

## 2019-12-18 LAB — IRON: Iron: 33 ug/dL — ABNORMAL LOW (ref 42–145)

## 2019-12-19 ENCOUNTER — Telehealth: Payer: Self-pay | Admitting: Family

## 2019-12-19 DIAGNOSIS — R87612 Low grade squamous intraepithelial lesion on cytologic smear of cervix (LGSIL): Secondary | ICD-10-CM

## 2019-12-19 LAB — CYTOLOGY - PAP

## 2019-12-19 NOTE — Telephone Encounter (Signed)
Please contact patient and let her know that her low when she is anemic.  She should repeat CBC, serum iron, and ferritin/chlamydia testing is negative HIV screen is negative.  Thyroid testing, cholesterol, are normal.  Pap smear is still pending.  I will route her the results in my chart when available.

## 2019-12-19 NOTE — Telephone Encounter (Signed)
Please advise pt that I reviewed her pap results and pap is abnormal. I would like her to see GYN for further evaluation. Referral is pended.

## 2019-12-19 NOTE — Telephone Encounter (Signed)
Called patient with results. Will call her again tomorrow after confirming she needs to repeat CBC

## 2019-12-20 ENCOUNTER — Other Ambulatory Visit: Payer: Self-pay

## 2019-12-20 DIAGNOSIS — D649 Anemia, unspecified: Secondary | ICD-10-CM

## 2019-12-20 NOTE — Telephone Encounter (Signed)
Patient advised she will need cbc in 3 months and start iron 325 once a day

## 2020-01-01 ENCOUNTER — Encounter: Payer: Self-pay | Admitting: Family

## 2020-01-07 ENCOUNTER — Encounter: Payer: Self-pay | Admitting: Family

## 2020-01-19 ENCOUNTER — Encounter: Payer: Self-pay | Admitting: Family

## 2020-03-02 ENCOUNTER — Ambulatory Visit (INDEPENDENT_AMBULATORY_CARE_PROVIDER_SITE_OTHER): Payer: 59 | Admitting: Obstetrics & Gynecology

## 2020-03-02 ENCOUNTER — Other Ambulatory Visit: Payer: Self-pay

## 2020-03-02 ENCOUNTER — Encounter: Payer: Self-pay | Admitting: Obstetrics & Gynecology

## 2020-03-02 VITALS — BP 122/76 | HR 88 | Ht 66.0 in | Wt 182.0 lb

## 2020-03-02 DIAGNOSIS — Z23 Encounter for immunization: Secondary | ICD-10-CM

## 2020-03-02 DIAGNOSIS — R87612 Low grade squamous intraepithelial lesion on cytologic smear of cervix (LGSIL): Secondary | ICD-10-CM

## 2020-03-02 NOTE — Progress Notes (Signed)
History:  24 y.o. G0P0000 here today for eval of abnormal PAP. 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 She was prev dx'd and treted for [facility] in 10/2019. She reports that her TOC was neg.    The following portions of the patient's history were reviewed and updated as appropriate: allergies, current medications, past family history, past medical history, past social history, past surgical history and problem list.  Review of Systems:  Pertinent items are noted in HPI.    Objective:  Physical Exam Blood pressure 122/76, pulse 88, height 5\' 6"  (1.676 m), weight 182 lb 0.6 oz (82.6 kg), last menstrual period 02/11/2020.  CONSTITUTIONAL: Well-developed, well-nourished female in no acute distress.  HENT:  Normocephalic, atraumatic EYES: Conjunctivae and EOM are normal. No scleral icterus.  NECK: Normal range of motion SKIN: Skin is warm and dry. No rash noted. Not diaphoretic.No pallor. NEUROLGIC: Alert and oriented to person, place, and time. Normal coordination.   Labs and Imaging 12/19/2019 Satisfactory for evaluation; transformation zone component PRESENT.    Diagnosis - Low grade squamous intraepithelial lesion (LSIL)Abnormal       Assessment & Plan:  Abnormal PAP   I have reviewed the management option with pt. She elects repeat PAP  HPV vaccine series beginning today  Condom use with all intercourse   F/u for repeat PAP in 12/2020.  Reviewed with pt the importance of follow up  I reviewed with pt the natural history of HPV and its relationship to cervical cancer.   All questions answered.   Total face-to-face time with patient was 30 min.  Greater than 50% was spent in counseling and coordination of care with the patient.   Adams Hinch L. Harraway-Smith, M.D., 01/2021

## 2020-03-23 ENCOUNTER — Other Ambulatory Visit: Payer: 59

## 2020-04-01 ENCOUNTER — Other Ambulatory Visit: Payer: 59

## 2020-04-08 ENCOUNTER — Encounter: Payer: Self-pay | Admitting: Family

## 2020-04-10 ENCOUNTER — Other Ambulatory Visit: Payer: Self-pay

## 2020-04-10 ENCOUNTER — Other Ambulatory Visit (INDEPENDENT_AMBULATORY_CARE_PROVIDER_SITE_OTHER): Payer: 59

## 2020-04-10 DIAGNOSIS — D649 Anemia, unspecified: Secondary | ICD-10-CM

## 2020-04-10 LAB — CBC
HCT: 31.6 % — ABNORMAL LOW (ref 36.0–46.0)
Hemoglobin: 10.1 g/dL — ABNORMAL LOW (ref 12.0–15.0)
MCHC: 31.9 g/dL (ref 30.0–36.0)
MCV: 76.7 fl — ABNORMAL LOW (ref 78.0–100.0)
Platelets: 257 10*3/uL (ref 150.0–400.0)
RBC: 4.13 Mil/uL (ref 3.87–5.11)
RDW: 17 % — ABNORMAL HIGH (ref 11.5–15.5)
WBC: 8.3 10*3/uL (ref 4.0–10.5)

## 2020-04-14 ENCOUNTER — Telehealth: Payer: Self-pay | Admitting: Family

## 2020-04-14 DIAGNOSIS — D649 Anemia, unspecified: Secondary | ICD-10-CM

## 2020-04-14 NOTE — Telephone Encounter (Signed)
Please advise pt blood work still showing anemia.  Please advise I would like her to return to the lab for additional blood tests.

## 2020-04-15 NOTE — Telephone Encounter (Signed)
Have called patient a few times since yesterday but phone keeps a busy signal.  MyChart message sent to patient with this information and to call for lab appointment.

## 2020-04-17 ENCOUNTER — Ambulatory Visit: Payer: 59 | Admitting: Family

## 2020-04-17 NOTE — Telephone Encounter (Signed)
Patient advised of results and additional labs needed, she will call back on Monday to set up lab appointment

## 2020-04-22 ENCOUNTER — Other Ambulatory Visit: Payer: Self-pay

## 2020-04-22 ENCOUNTER — Other Ambulatory Visit (INDEPENDENT_AMBULATORY_CARE_PROVIDER_SITE_OTHER): Payer: 59

## 2020-04-22 DIAGNOSIS — D649 Anemia, unspecified: Secondary | ICD-10-CM

## 2020-04-23 LAB — IRON,TIBC AND FERRITIN PANEL
%SAT: 10 % (calc) — ABNORMAL LOW (ref 16–45)
Ferritin: 4 ng/mL — ABNORMAL LOW (ref 16–154)
Iron: 42 ug/dL (ref 40–190)
TIBC: 411 mcg/dL (calc) (ref 250–450)

## 2020-04-24 ENCOUNTER — Ambulatory Visit (INDEPENDENT_AMBULATORY_CARE_PROVIDER_SITE_OTHER): Payer: 59 | Admitting: Family

## 2020-04-24 ENCOUNTER — Other Ambulatory Visit: Payer: Self-pay

## 2020-04-24 ENCOUNTER — Encounter: Payer: Self-pay | Admitting: Family

## 2020-04-24 VITALS — BP 124/68 | HR 77 | Resp 16 | Ht 66.0 in | Wt 185.0 lb

## 2020-04-24 DIAGNOSIS — L608 Other nail disorders: Secondary | ICD-10-CM | POA: Diagnosis not present

## 2020-04-24 DIAGNOSIS — Z309 Encounter for contraceptive management, unspecified: Secondary | ICD-10-CM

## 2020-04-24 LAB — POCT URINE PREGNANCY: Preg Test, Ur: NEGATIVE

## 2020-04-24 MED ORDER — LEVONORGEST-ETH ESTRAD 91-DAY 0.15-0.03 MG PO TABS: 1.0000 | ORAL_TABLET | Freq: Every day | ORAL | 4 refills | Status: DC

## 2020-04-24 NOTE — Addendum Note (Signed)
Addended by: Wilford Corner on: 04/24/2020 01:31 PM   Modules accepted: Orders

## 2020-04-24 NOTE — Progress Notes (Signed)
Subjective:    Patient ID: Rebekah Carter, female    DOB: 1996/06/10, 24 y.o.   MRN: [id number]  HPI  Patient is a 24 yr old female who presents today to discuss menstrual concerns.  She reports that she had 2 periods last month and a 9 day period in April. LMP 6/28. She states that she is no longer desiring pregnancy. She reports that the irregular periods really "stress me out."   Has had a lot of stress. Not sleeping well.  She has tried adding walks in the evening to help with sleep. Has follow up next week with her psychiatrist to further discuss next week. She sees Dr.  Marland KitchenB" at [facility].    Anemia- reports good compliance with iron.  She also reports that she has developed a dark stripe running through the nail on her right small toe (states nail is covered by a dark polish today to hide it).  Reports that the stipe is becoming wider.   Review of Systems See HPI  Past Medical History:  Diagnosis Date  . Anemia   . Anxiety   . Asthma    as a child   . Refusal of blood transfusions as patient is Jehovah's Witness 12/17/2019  . Seizures ([facility])    last seizure 8 years ago      Social History   Socioeconomic History  . Marital status: Single    Spouse name: Not on file  . Number of children: Not on file  . Years of education: Not on file  . Highest education level: Not on file  Occupational History  . Not on file  Tobacco Use  . Smoking status: Never Smoker  . Smokeless tobacco: Never Used  Substance and Sexual Activity  . Alcohol use: No  . Drug use: No  . Sexual activity: Never  Other Topics Concern  . 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         Social Determinants of Health   Financial Resource Strain:   . Difficulty of Paying Living Expenses:   Food Insecurity:   . Worried About Programme researcher, broadcasting/film/video in the Last Year:   . Barista in the Last Year:     Transportation Needs:   . Freight forwarder (Medical):   Marland Kitchen Lack of Transportation (Non-Medical):   Physical Activity:   . Days of Exercise per Week:   . Minutes of Exercise per Session:   Stress:   . Feeling of Stress :   Social Connections:   . Frequency of Communication with Friends and Family:   . Frequency of Social Gatherings with Friends and Family:   . Attends Religious Services:   . Active Member of Clubs or Organizations:   . Attends Banker Meetings:   Marland Kitchen Marital Status:   Intimate Partner Violence:   . Fear of Current or Ex-Partner:   . Emotionally Abused:   Marland Kitchen Physically Abused:   . Sexually Abused:     Past Surgical History:  Procedure Laterality Date  . LUMBAR LAMINECTOMY/DECOMPRESSION MICRODISCECTOMY Right 12/02/2015   Procedure: CENTRAL DECOMPRESSION L5 - S1 AND L4 - L5 FOR SPINAL STENOSIS, MICRODISCECTOMY L4-L5, L5-S1, FORAMINOTOMIES L5 ADN S1 ROOT ON THE RIGHT;  Surgeon: Ranee Gosselin, MD;  Location: [facility];  Service: Orthopedics;  Laterality: Right;  .  TONSILLECTOMY AND ADENOIDECTOMY  2004    Family History  Problem Relation Age of Onset  . Diabetes Maternal Grandfather   . Hypertension Maternal Grandfather   . Diabetes Paternal Grandfather   . Hypertension Paternal Grandfather     Allergies  Allergen Reactions  . Amoxicillin     REACTION: rash,seizures  . Other     Patient is a Scientist, product/process development   . Latex Rash  . Peanut-Containing Drug Products Itching, Swelling and Rash    Current Outpatient Medications on File Prior to Visit  Medication Sig Dispense Refill  . ferrous sulfate 325 (65 FE) MG tablet Take 325 mg by mouth 2 (two) times daily with a meal.    . sertraline (ZOLOFT) 25 MG tablet Take 25 mg by mouth at bedtime.     No current facility-administered medications on file prior to visit.    BP 124/68 (BP Location: Right Arm, Patient Position: Sitting, Cuff Size: Normal)   Pulse 77   Resp 16   Ht 5\' 6"  (1.676 m)   Wt  185 lb (83.9 kg)   LMP 04/06/2020 (Exact Date)   SpO2 98%   BMI 29.86 kg/m       Objective:   Physical Exam Constitutional:      Appearance: Normal appearance.  Neurological:     Mental Status: She is alert and oriented to person, place, and time.  Psychiatric:        Attention and Perception: Attention normal.        Mood and Affect: Mood normal.        Speech: Speech normal.        Behavior: Behavior normal.        Cognition and Memory: Cognition normal.           Assessment & Plan:  Irregular menses- New. Since she is no longer desiring pregnancy, will give trial of seasonale. This will hopefully also help with her anemia.  Anemia- Hgb is still low. Continue iron supplement. Plan to recheck cbc and iron studies at her follow up appointment in 4 months.  Lab Results  Component Value Date   WBC 8.3 04/10/2020   HGB 10.1 (L) 04/10/2020   HCT 31.6 (L) 04/10/2020   MCV 76.7 (L) 04/10/2020   PLT 257.0 04/10/2020   Toenail discoloration- refer to podiatry for further evaluation.   This visit occurred during the SARS-CoV-2 public health emergency.  Safety protocols were in place, including screening questions prior to the visit, additional usage of staff PPE, and extensive cleaning of exam room while observing appropriate contact time as indicated for disinfecting solutions.

## 2020-04-24 NOTE — Patient Instructions (Signed)
Please begin Seasonale (birth control pill).

## 2020-05-01 ENCOUNTER — Other Ambulatory Visit: Payer: Self-pay

## 2020-05-01 ENCOUNTER — Ambulatory Visit (INDEPENDENT_AMBULATORY_CARE_PROVIDER_SITE_OTHER): Payer: 59

## 2020-05-01 VITALS — BP 124/68 | HR 78 | Wt 185.0 lb

## 2020-05-01 DIAGNOSIS — Z23 Encounter for immunization: Secondary | ICD-10-CM

## 2020-05-01 NOTE — Progress Notes (Addendum)
Patient presents for her 2nd injection of HPV vaccine. Patient to return at end of November for her third dose. Armandina Stammer RN   Attestation of Attending Supervision of RN: Evaluation and management procedures were performed by the nurse under my supervision and collaboration.  I have reviewed the nursing note and chart, and I agree with the management and plan.  Carolyn L. Harraway-Smith, M.D., [place]

## 2020-05-18 ENCOUNTER — Ambulatory Visit: Payer: Self-pay | Admitting: Podiatry

## 2020-06-01 ENCOUNTER — Ambulatory Visit: Payer: Self-pay | Admitting: Podiatry

## 2020-07-02 ENCOUNTER — Ambulatory Visit: Payer: 59

## 2020-08-25 ENCOUNTER — Ambulatory Visit (INDEPENDENT_AMBULATORY_CARE_PROVIDER_SITE_OTHER): Payer: 59 | Admitting: Family

## 2020-08-25 ENCOUNTER — Other Ambulatory Visit: Payer: Self-pay

## 2020-08-25 ENCOUNTER — Ambulatory Visit: Payer: 59 | Admitting: Family

## 2020-08-25 VITALS — BP 125/81 | HR 105 | Temp 98.5°F | Resp 16 | Wt 182.8 lb

## 2020-08-25 DIAGNOSIS — Z23 Encounter for immunization: Secondary | ICD-10-CM | POA: Diagnosis not present

## 2020-08-25 DIAGNOSIS — D509 Iron deficiency anemia, unspecified: Secondary | ICD-10-CM | POA: Diagnosis not present

## 2020-08-25 DIAGNOSIS — R5383 Other fatigue: Secondary | ICD-10-CM | POA: Diagnosis not present

## 2020-08-25 NOTE — Progress Notes (Signed)
Subjective:    Patient ID: Rebekah Carter, female    DOB: Oct 28, 1995, 24 y.o.   MRN: [id number]  HPI  Patient is a 24 yr old female who presents today for follow up.  Anemia- reports that she is taking iron. She is taking protein shakes.  Just graduated and started a new job so she has not been sleeping well. 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  She is taking iron bid.    Lab Results  Component Value Date   WBC 8.3 04/10/2020   HGB 10.1 (L) 04/10/2020   HCT 31.6 (L) 04/10/2020   MCV 76.7 (L) 04/10/2020   PLT 257.0 04/10/2020   Goes to bed at 10, wakes up at 8 am,    Review of Systems See HPI  Past Medical History:  Diagnosis Date  . Anemia   . Anxiety   . Asthma    as a child   . Refusal of blood transfusions as patient is Jehovah's Witness 12/17/2019  . Seizures (HCC)    last seizure 8 years ago      Social History   Socioeconomic History  . Marital status: Single    Spouse name: Not on file  . Number of children: Not on file  . Years of education: Not on file  . Highest education level: Not on file  Occupational History  . Not on file  Tobacco Use  . Smoking status: Never Smoker  . Smokeless tobacco: Never Used  Substance and Sexual Activity  . Alcohol use: No  . Drug use: No  . Sexual activity: Never  Other Topics Concern  . 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         Social Determinants of Health   Financial Resource Strain:   . Difficulty of Paying Living Expenses: Not on file  Food Insecurity:   . Worried About Programme researcher, broadcasting/film/video in the Last Year: Not on file  . Ran Out of Food in the Last Year: Not on file  Transportation Needs:   . Lack of Transportation (Medical): Not on file  . Lack of Transportation (Non-Medical): Not on file  Physical Activity:   . Days of Exercise per  Week: Not on file  . Minutes of Exercise per Session: Not on file  Stress:   . Feeling of Stress : Not on file  Social Connections:   . Frequency of Communication with Friends and Family: Not on file  . Frequency of Social Gatherings with Friends and Family: Not on file  . Attends Religious Services: Not on file  . Active Member of Clubs or Organizations: Not on file  . Attends Banker Meetings: Not on file  . Marital Status: Not on file  Intimate Partner Violence:   . Fear of Current or Ex-Partner: Not on file  . Emotionally Abused: Not on file  . Physically Abused: Not on file  . Sexually Abused: Not on file    Past Surgical History:  Procedure Laterality Date  . LUMBAR LAMINECTOMY/DECOMPRESSION MICRODISCECTOMY Right 12/02/2015   Procedure: CENTRAL DECOMPRESSION L5 - S1 AND L4 - L5 FOR SPINAL STENOSIS, MICRODISCECTOMY L4-L5, L5-S1, FORAMINOTOMIES L5 ADN S1 ROOT ON THE RIGHT;  Surgeon: Ranee Gosselin, MD;  Location: [facility];  Service: Orthopedics;  Laterality: Right;  . TONSILLECTOMY AND ADENOIDECTOMY  2004    Family History  Problem Relation Age of Onset  . Diabetes Maternal Grandfather   . Hypertension Maternal Grandfather   . Diabetes Paternal Grandfather   . Hypertension Paternal Grandfather     Allergies  Allergen Reactions  . Amoxicillin     REACTION: rash,seizures  . Other     Patient is a Scientist, product/process development   . Latex Rash  . Peanut-Containing Drug Products Itching, Swelling and Rash    Current Outpatient Medications on File Prior to Visit  Medication Sig Dispense Refill  . ferrous sulfate 325 (65 FE) MG tablet Take 325 mg by mouth 2 (two) times daily with a meal.    . sertraline (ZOLOFT) 25 MG tablet Take 25 mg by mouth at bedtime.     No current facility-administered medications on file prior to visit.    BP 125/81 (BP Location: Left Arm, Patient Position: Sitting, Cuff Size: Small)   Pulse (!) 105   Temp 98.5 F (36.9 C) (Oral)   Resp 16    Wt 182 lb 12.8 oz (82.9 kg)   SpO2 100%   BMI 29.50 kg/m       Objective:   Physical Exam Constitutional:      Appearance: She is well-developed.  Cardiovascular:     Rate and Rhythm: Normal rate and regular rhythm.     Heart sounds: Normal heart sounds. No murmur heard.   Pulmonary:     Effort: Pulmonary effort is normal. No respiratory distress.     Breath sounds: Normal breath sounds. No wheezing.  Psychiatric:        Behavior: Behavior normal.        Thought Content: Thought content normal.        Judgment: Judgment normal.           Assessment & Plan:  Anemia- will obtain follow up CBC, iron studies.  Continue iron. She states that she has not been having heavy periods.  Fatigue- check TSH.  Flu shot today.  This visit occurred during the SARS-CoV-2 public health emergency.  Safety protocols were in place, including screening questions prior to the visit, additional usage of staff PPE, and extensive cleaning of exam room while observing appropriate contact time as indicated for disinfecting solutions.

## 2020-08-25 NOTE — Patient Instructions (Signed)
Please complete lab work prior to leaving.   

## 2020-08-26 ENCOUNTER — Telehealth: Payer: Self-pay | Admitting: Family

## 2020-08-26 DIAGNOSIS — D509 Iron deficiency anemia, unspecified: Secondary | ICD-10-CM

## 2020-08-26 LAB — CBC WITH DIFFERENTIAL/PLATELET
Basophils Absolute: 0.1 10*3/uL (ref 0.0–0.1)
Basophils Relative: 1.3 % (ref 0.0–3.0)
Eosinophils Absolute: 0.1 10*3/uL (ref 0.0–0.7)
Eosinophils Relative: 2.4 % (ref 0.0–5.0)
HCT: 33.6 % — ABNORMAL LOW (ref 36.0–46.0)
Hemoglobin: 10.6 g/dL — ABNORMAL LOW (ref 12.0–15.0)
Lymphocytes Relative: 35.5 % (ref 12.0–46.0)
Lymphs Abs: 1.9 10*3/uL (ref 0.7–4.0)
MCHC: 31.5 g/dL (ref 30.0–36.0)
MCV: 73.2 fl — ABNORMAL LOW (ref 78.0–100.0)
Monocytes Absolute: 0.4 10*3/uL (ref 0.1–1.0)
Monocytes Relative: 8 % (ref 3.0–12.0)
Neutro Abs: 2.8 10*3/uL (ref 1.4–7.7)
Neutrophils Relative %: 52.8 % (ref 43.0–77.0)
Platelets: 178 10*3/uL (ref 150.0–400.0)
RBC: 4.58 Mil/uL (ref 3.87–5.11)
RDW: 16.7 % — ABNORMAL HIGH (ref 11.5–15.5)
WBC: 5.2 10*3/uL (ref 4.0–10.5)

## 2020-08-26 LAB — IRON: Iron: 23 ug/dL — ABNORMAL LOW (ref 42–145)

## 2020-08-26 LAB — TSH: TSH: 0.98 u[IU]/mL (ref 0.35–4.50)

## 2020-08-26 LAB — FERRITIN: Ferritin: 9.1 ng/mL — ABNORMAL LOW (ref 10.0–291.0)

## 2020-08-26 NOTE — Telephone Encounter (Signed)
Patient advised of results and referral, she agrees with plan of care.

## 2020-08-26 NOTE — Telephone Encounter (Signed)
Please advise patient that her iron level is still low and she is still anemic.  I would like to refer her to hematology for iv iron infusions.

## 2020-08-31 ENCOUNTER — Telehealth: Payer: Self-pay

## 2020-08-31 NOTE — Telephone Encounter (Signed)
Pt called in to sch her NP appt per ref...Marland KitchenMarland KitchenMarland Kitchen AOM

## 2020-09-01 ENCOUNTER — Other Ambulatory Visit: Payer: Self-pay

## 2020-09-01 ENCOUNTER — Ambulatory Visit (INDEPENDENT_AMBULATORY_CARE_PROVIDER_SITE_OTHER): Payer: 59

## 2020-09-01 DIAGNOSIS — Z23 Encounter for immunization: Secondary | ICD-10-CM

## 2020-09-01 NOTE — Progress Notes (Signed)
Chart reviewed - agree with CMA/RN documentation.  ° °

## 2020-09-01 NOTE — Progress Notes (Signed)
Patient presents for third injection of series for HPV. Armandina Stammer RN

## 2020-09-13 ENCOUNTER — Encounter: Payer: Self-pay | Admitting: Family

## 2020-09-14 NOTE — Telephone Encounter (Signed)
Patient scheduled for virtual visit tomorrow for dark urine and irregular period

## 2020-09-15 ENCOUNTER — Telehealth (INDEPENDENT_AMBULATORY_CARE_PROVIDER_SITE_OTHER): Payer: 59 | Admitting: Family

## 2020-09-15 ENCOUNTER — Other Ambulatory Visit: Payer: Self-pay

## 2020-09-15 ENCOUNTER — Other Ambulatory Visit: Payer: 59

## 2020-09-15 DIAGNOSIS — D509 Iron deficiency anemia, unspecified: Secondary | ICD-10-CM

## 2020-09-15 DIAGNOSIS — R8290 Unspecified abnormal findings in urine: Secondary | ICD-10-CM

## 2020-09-15 DIAGNOSIS — N926 Irregular menstruation, unspecified: Secondary | ICD-10-CM | POA: Diagnosis not present

## 2020-09-15 MED ORDER — XULANE 150-35 MCG/24HR TD PTWK: 1.0000 | MEDICATED_PATCH | TRANSDERMAL | 12 refills | Status: DC

## 2020-09-15 NOTE — Progress Notes (Deleted)
Acute Office Visit  Subjective:    Patient ID: Rebekah Carter, female    DOB: 02/20/1996, 24 y.o.   MRN: [id number]  Chief Complaint  Patient presents with  . Hematuria    Patient complains of "dark urine"  . Menstrual Problem    complains of irregular cicles    HPI Patient is in today for ***  Past Medical History:  Diagnosis Date  . Anemia   . Anxiety   . Asthma    as a child   . Refusal of blood transfusions as patient is Jehovah's Witness 12/17/2019  . Seizures (HCC)    last seizure 8 years ago     Past Surgical History:  Procedure Laterality Date  . LUMBAR LAMINECTOMY/DECOMPRESSION MICRODISCECTOMY Right 12/02/2015   Procedure: CENTRAL DECOMPRESSION L5 - S1 AND L4 - L5 FOR SPINAL STENOSIS, MICRODISCECTOMY L4-L5, L5-S1, FORAMINOTOMIES L5 ADN S1 ROOT ON THE RIGHT;  Surgeon: Ranee Gosselin, MD;  Location: [facility];  Service: Orthopedics;  Laterality: Right;  . TONSILLECTOMY AND ADENOIDECTOMY  2004    Family History  Problem Relation Age of Onset  . Diabetes Maternal Grandfather   . Hypertension Maternal Grandfather   . Diabetes Paternal Grandfather   . Hypertension Paternal Grandfather     Social History   Socioeconomic History  . Marital status: Single    Spouse name: Not on file  . Number of children: Not on file  . Years of education: Not on file  . Highest education level: Not on file  Occupational History  . Not on file  Tobacco Use  . Smoking status: Never Smoker  . Smokeless tobacco: Never Used  Substance and Sexual Activity  . Alcohol use: No  . Drug use: No  . Sexual activity: Never  Other Topics Concern  . 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         Social Determinants of Health   Financial Resource Strain:   . Difficulty of Paying Living Expenses: Not on file  Food Insecurity:   . Worried About Programme researcher, broadcasting/film/video in the Last  Year: Not on file  . Ran Out of Food in the Last Year: Not on file  Transportation Needs:   . Lack of Transportation (Medical): Not on file  . Lack of Transportation (Non-Medical): Not on file  Physical Activity:   . Days of Exercise per Week: Not on file  . Minutes of Exercise per Session: Not on file  Stress:   . Feeling of Stress : Not on file  Social Connections:   . Frequency of Communication with Friends and Family: Not on file  . Frequency of Social Gatherings with Friends and Family: Not on file  . Attends Religious Services: Not on file  . Active Member of Clubs or Organizations: Not on file  . Attends Banker Meetings: Not on file  . Marital Status: Not on file  Intimate Partner Violence:   . Fear of Current or Ex-Partner: Not on file  . Emotionally Abused: Not on file  . Physically Abused: Not on file  . Sexually Abused: Not on file    Outpatient Medications Prior to Visit  Medication Sig Dispense Refill  . ferrous sulfate 325 (65 FE) MG tablet Take 325 mg by mouth 2 (two) times daily with a meal.    .  sertraline (ZOLOFT) 25 MG tablet Take 25 mg by mouth at bedtime.     No facility-administered medications prior to visit.    Allergies  Allergen Reactions  . Amoxicillin     REACTION: rash,seizures  . Other     Patient is a Scientist, product/process development   . Latex Rash  . Peanut-Containing Drug Products Itching, Swelling and Rash    Review of Systems     Objective:    Physical Exam  There were no vitals taken for this visit. Wt Readings from Last 3 Encounters:  08/25/20 182 lb 12.8 oz (82.9 kg)  05/01/20 185 lb (83.9 kg)  04/24/20 185 lb (83.9 kg)    Health Maintenance Due  Topic Date Due  . Hepatitis C Screening  Never done    There are no preventive care reminders to display for this patient.   Lab Results  Component Value Date   TSH 0.98 08/25/2020   Lab Results  Component Value Date   WBC 5.2 08/25/2020   HGB 10.6 (L) 08/25/2020    HCT 33.6 (L) 08/25/2020   MCV 73.2 (L) 08/25/2020   PLT 178.0 08/25/2020   Lab Results  Component Value Date   NA 134 (L) 12/17/2019   K 3.8 12/17/2019   CO2 25 12/17/2019   GLUCOSE 85 12/17/2019   BUN 9 12/17/2019   CREATININE 0.62 12/17/2019   BILITOT 0.4 12/17/2019   ALKPHOS 76 12/17/2019   AST 15 12/17/2019   ALT 12 12/17/2019   PROT 7.1 12/17/2019   ALBUMIN 4.1 12/17/2019   CALCIUM 9.0 12/17/2019   ANIONGAP 9 12/04/2015   GFR 116.95 12/17/2019   Lab Results  Component Value Date   CHOL 137 12/17/2019   Lab Results  Component Value Date   HDL 37.10 (L) 12/17/2019   Lab Results  Component Value Date   LDLCALC 83 12/17/2019   Lab Results  Component Value Date   TRIG 81.0 12/17/2019   Lab Results  Component Value Date   CHOLHDL 4 12/17/2019   No results found for: HGBA1C     Assessment & Plan:   Problem List Items Addressed This Visit    None    Visit Diagnoses    Foul smelling urine    -  Primary   Relevant Orders   Urine Culture       Meds ordered this encounter  Medications  . norelgestromin-ethinyl estradiol [facility]) 150-35 MCG/24HR transdermal patch    Sig: Place 1 patch onto the skin once a week.    Dispense:  3 patch    Refill:  12    Order Specific Question:   Supervising Provider    Answer:   Danise Edge A [[id number]]     Wilford Corner, CMA

## 2020-09-15 NOTE — Progress Notes (Signed)
Virtual Visit via Video Note  I connected with Rebekah Carter on 09/15/20 at  9:40 AM EST  and verified that I am speaking with the correct person using two identifiers. She was unable to connect to our video platform so we transitioned to a telephone visit.   Location: Patient: home Provider: home   I discussed the limitations of evaluation and management by telemedicine and the availability of in person appointments. The patient expressed understanding and agreed to proceed. Only the patient and myself were present for today's video call.   History of Present Illness:  Patient is a 24 yr old female who presents today with several concerns:   Reports that her urine is a dark yellow with odor despite increased hydration. Denies dysuria.   Irregular bleeding- She states that she had difficulty remembering to take her OCP pills. [facility] was switched to the [place] patch. Has been using the patch for a little over 2 months. States that she had her period during her scheduled no patch week and it ended 14 days ago.  She replaced the patch as scheduled and then developed recurrent bleeding 2 days ago.  She decided to take her patch off yesterday.  Depression- continues zoloft which is being prescribed by psychiatry. Reports mood has been good.  Just having regular stress at school.   Iron deficiency anemia- she continues oral iron supplement. Has an appointment scheduled with hematology in a few weeks to discuss IV iron infusion.   Foul smelling urine- she will stop by to leave a urine for specimen. We discussed that if her urine remains dark she can let me know and we can bring her back in a few weeks to complete a UA to look for hematuria.  Will be falsely + today due to menstrual bleeding.    Observations/Objective:   Gen: Awake, alert, no acute distress Resp: Breathing is even and non-labored Psych: calm/pleasant demeanor Neuro: Alert and  Oriented x 3, + facial symmetry, speech is clear.   Assessment and Plan:  Irregular menstrual bleeding- advised pt to replace patch today and use back up birth control for the next 1-2 weeks. Advised her to schedule follow up with her OB/GYN.  Iron deficiency anemia- continue oral iron for now. Pt advised to keep her upcoming appointment with hematology.  Foul smelling urine- she will come by the office to provide urine specimen for culture this AM.   20 minutes spent on today's telephone visit.   Follow Up Instructions:    I discussed the assessment and treatment plan with the patient. The patient was provided an opportunity to ask questions and all were answered. The patient agreed with the plan and demonstrated an understanding of the instructions.   The patient was advised to call back or seek an in-person evaluation if the symptoms worsen or if the condition fails to improve as anticipated.  Lemont Fillers, NP

## 2020-09-16 LAB — URINE CULTURE
MICRO NUMBER:: 11286395
Result:: NO GROWTH
SPECIMEN QUALITY:: ADEQUATE

## 2020-09-25 ENCOUNTER — Other Ambulatory Visit: Payer: Self-pay | Admitting: Family

## 2020-09-25 DIAGNOSIS — D649 Anemia, unspecified: Secondary | ICD-10-CM

## 2020-09-25 DIAGNOSIS — D500 Iron deficiency anemia secondary to blood loss (chronic): Secondary | ICD-10-CM

## 2020-09-28 ENCOUNTER — Inpatient Hospital Stay (HOSPITAL_BASED_OUTPATIENT_CLINIC_OR_DEPARTMENT_OTHER): Payer: 59 | Admitting: Family

## 2020-09-28 ENCOUNTER — Other Ambulatory Visit: Payer: Self-pay

## 2020-09-28 ENCOUNTER — Encounter: Payer: Self-pay | Admitting: Family

## 2020-09-28 ENCOUNTER — Inpatient Hospital Stay: Payer: 59 | Attending: Hematology & Oncology

## 2020-09-28 VITALS — BP 120/80 | HR 71 | Temp 97.7°F | Resp 18 | Ht 66.0 in | Wt 183.0 lb

## 2020-09-28 DIAGNOSIS — D500 Iron deficiency anemia secondary to blood loss (chronic): Secondary | ICD-10-CM

## 2020-09-28 DIAGNOSIS — F419 Anxiety disorder, unspecified: Secondary | ICD-10-CM

## 2020-09-28 DIAGNOSIS — Z79899 Other long term (current) drug therapy: Secondary | ICD-10-CM | POA: Insufficient documentation

## 2020-09-28 DIAGNOSIS — Z8249 Family history of ischemic heart disease and other diseases of the circulatory system: Secondary | ICD-10-CM | POA: Diagnosis not present

## 2020-09-28 DIAGNOSIS — J45909 Unspecified asthma, uncomplicated: Secondary | ICD-10-CM | POA: Insufficient documentation

## 2020-09-28 DIAGNOSIS — Z833 Family history of diabetes mellitus: Secondary | ICD-10-CM

## 2020-09-28 DIAGNOSIS — D509 Iron deficiency anemia, unspecified: Secondary | ICD-10-CM | POA: Insufficient documentation

## 2020-09-28 DIAGNOSIS — D649 Anemia, unspecified: Secondary | ICD-10-CM

## 2020-09-28 LAB — CBC WITH DIFFERENTIAL (CANCER CENTER ONLY)
Abs Immature Granulocytes: 0.04 10*3/uL (ref 0.00–0.07)
Basophils Absolute: 0.1 10*3/uL (ref 0.0–0.1)
Basophils Relative: 1 %
Eosinophils Absolute: 0.3 10*3/uL (ref 0.0–0.5)
Eosinophils Relative: 5 %
HCT: 33.1 % — ABNORMAL LOW (ref 36.0–46.0)
Hemoglobin: 9.8 g/dL — ABNORMAL LOW (ref 12.0–15.0)
Immature Granulocytes: 1 %
Lymphocytes Relative: 43 %
Lymphs Abs: 2.7 10*3/uL (ref 0.7–4.0)
MCH: 21.9 pg — ABNORMAL LOW (ref 26.0–34.0)
MCHC: 29.6 g/dL — ABNORMAL LOW (ref 30.0–36.0)
MCV: 73.9 fL — ABNORMAL LOW (ref 80.0–100.0)
Monocytes Absolute: 0.5 10*3/uL (ref 0.1–1.0)
Monocytes Relative: 8 %
Neutro Abs: 2.6 10*3/uL (ref 1.7–7.7)
Neutrophils Relative %: 42 %
Platelet Count: 209 10*3/uL (ref 150–400)
RBC: 4.48 MIL/uL (ref 3.87–5.11)
RDW: 15.6 % — ABNORMAL HIGH (ref 11.5–15.5)
WBC Count: 6.1 10*3/uL (ref 4.0–10.5)
nRBC: 0 % (ref 0.0–0.2)

## 2020-09-28 LAB — CMP (CANCER CENTER ONLY)
ALT: 13 U/L (ref 0–44)
AST: 16 U/L (ref 15–41)
Albumin: 4.4 g/dL (ref 3.5–5.0)
Alkaline Phosphatase: 51 U/L (ref 38–126)
Anion gap: 7 (ref 5–15)
BUN: 11 mg/dL (ref 6–20)
CO2: 25 mmol/L (ref 22–32)
Calcium: 9.7 mg/dL (ref 8.9–10.3)
Chloride: 105 mmol/L (ref 98–111)
Creatinine: 0.76 mg/dL (ref 0.44–1.00)
GFR, Estimated: >60 mL/min (ref >60)
Glucose, Bld: 103 mg/dL — ABNORMAL HIGH (ref 70–99)
Potassium: 3.8 mmol/L (ref 3.5–5.1)
Sodium: 137 mmol/L (ref 135–145)
Total Bilirubin: 0.3 mg/dL (ref 0.3–1.2)
Total Protein: 7.8 g/dL (ref 6.5–8.1)

## 2020-09-28 LAB — RETICULOCYTES
Immature Retic Fract: 14.8 % (ref 2.3–15.9)
RBC.: 4.55 MIL/uL (ref 3.87–5.11)
Retic Count, Absolute: 60.1 10*3/uL (ref 19.0–186.0)
Retic Ct Pct: 1.3 % (ref 0.4–3.1)

## 2020-09-28 LAB — SAVE SMEAR(SSMR), FOR PROVIDER SLIDE REVIEW

## 2020-09-28 LAB — LACTATE DEHYDROGENASE: LDH: 163 U/L (ref 98–192)

## 2020-09-28 NOTE — Progress Notes (Signed)
Hematology/Oncology Consultation   Name: Rebekah Carter      MRN: [id number]    Location: Room/bed info not found  Date: 09/28/2020 Time:3:23 PM   REFERRING PHYSICIAN: Sandford Craze, NP  REASON FOR CONSULT: Iron deficiency anemia    DIAGNOSIS: Iron deficiency anemia   Of Note: Patient is Jehovah's Witness, no blood products   HISTORY OF PRESENT ILLNESS: Rebekah Carter is a very pleasant 24 yo Hispanic female with history of iron deficiency anemia. She has tried and failed oral iron.  She states that her cycle is irregular sometimes occurring twice a month. She has not noted any other blood loss. She bruises easily on her arms but not in excess. No petechiae.  She is symptomatic with fatigue, not sleeping well, chewing ice and palpitations with over exertion.  Her mother is also anemia and recently got her first iron infusion.  She has been on the [place] birth control patch for 3 months now. No history of pregnancy or miscarriage.  She had lower back surgery for central decompression of L5-S1 and L4-L5 for spinal stenosis, microdiscectomy at L4-L5, L5-S1 and foraminotomies L5 and S1 root on the right in 2017 without any complications.  No personal or familial history of cancer.  No sickle cell disease or trait that she is aware of.  No history of diabetes or thyroid disease.  No fever, chills, n/v, cough, rash, dizziness, SOB, chest pain, abdominal pain or changes in bowel or bladder habits.  She has constipation with the iron supplement.  No swelling, tenderness, numbness or tingling in her extremities at this time.  No falls or syncope.  Her appetite comes and goes. She does eat healthy and is not vegan or vegetarian. She has been making a conscious effort to stay well hydrated. Her weight is described as stable.  No smoking or recreational drug use.  She has the rare alcoholic beverage on holidays.  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    ROS: All other 10 point review of systems is negative.   PAST MEDICAL HISTORY:   Past Medical History:  Diagnosis Date  . Anemia   . Anxiety   . Asthma    as a child   . Refusal of blood transfusions as patient is Jehovah's Witness 12/17/2019  . Seizures (HCC)    last seizure 8 years ago     ALLERGIES: Allergies  Allergen Reactions  . Amoxicillin     REACTION: rash,seizures  . Other     Patient is a Scientist, product/process development   . Latex Rash  . Peanut-Containing Drug Products Itching, Swelling and Rash      MEDICATIONS:  Current Outpatient Medications on File Prior to Visit  Medication Sig Dispense Refill  . ferrous sulfate 325 (65 FE) MG tablet Take 325 mg by mouth 2 (two) times daily with a meal.    . norelgestromin-ethinyl estradiol [facility]) 150-35 MCG/24HR transdermal patch Place 1 patch onto the skin once a week. 3 patch 12  . sertraline (ZOLOFT) 25 MG tablet Take 25 mg by mouth at bedtime.     No current facility-administered medications on file prior to visit.     PAST SURGICAL HISTORY Past Surgical History:  Procedure Laterality Date  . LUMBAR LAMINECTOMY/DECOMPRESSION MICRODISCECTOMY Right 12/02/2015   Procedure: CENTRAL DECOMPRESSION L5 - S1 AND L4 - L5 FOR SPINAL STENOSIS, MICRODISCECTOMY L4-L5, L5-S1, FORAMINOTOMIES L5 ADN S1 ROOT ON THE RIGHT;  Surgeon: Ranee Gosselin, MD;  Location: [facility];  Service: Orthopedics;  Laterality: Right;  . TONSILLECTOMY AND ADENOIDECTOMY  2004    FAMILY HISTORY: Family History  Problem Relation Age of Onset  . Diabetes Maternal Grandfather   . Hypertension Maternal Grandfather   . Diabetes Paternal Grandfather   . Hypertension Paternal Grandfather     SOCIAL HISTORY:  reports that she has never smoked. She has never used smokeless tobacco. She reports that she does not drink alcohol and does not use drugs.  PERFORMANCE STATUS: The patient's performance status is 1 - Symptomatic but completely ambulatory  PHYSICAL EXAM: Most  Recent Vital Signs: There were no vitals taken for this visit. BP 120/80 (BP Location: Left Arm, Patient Position: Sitting)   Pulse 71   Temp 97.7 F (36.5 C) (Oral)   Resp 18   Ht 5\' 6"  (1.676 m)   Wt 183 lb (83 kg)   SpO2 98%   BMI 29.54 kg/m   General Appearance:    Alert, cooperative, no distress, appears stated age  Head:    Normocephalic, without obvious abnormality, atraumatic  Eyes:    PERRL, conjunctiva/corneas clear, EOM's intact, fundi    benign, both eyes        Throat:   Lips, mucosa, and tongue normal; teeth and gums normal  Neck:   Supple, symmetrical, trachea midline, no adenopathy;    thyroid:  no enlargement/tenderness/nodules; no carotid   bruit or JVD  Back:     Symmetric, no curvature, ROM normal, no CVA tenderness  Lungs:     Clear to auscultation bilaterally, respirations unlabored  Chest Wall:    No tenderness or deformity   Heart:    Regular rate and rhythm, S1 and S2 normal, no murmur, rub   or gallop     Abdomen:     Soft, non-tender, bowel sounds active all four quadrants,    no masses, no organomegaly        Extremities:   Extremities normal, atraumatic, no cyanosis or edema  Pulses:   2+ and symmetric all extremities  Skin:   Skin color, texture, turgor normal, no rashes or lesions  Lymph nodes:   Cervical, supraclavicular, and axillary nodes normal  Neurologic:   CNII-XII intact, normal strength, sensation and reflexes    throughout    LABORATORY DATA:  Results for orders placed or performed in visit on 09/28/20 (from the past 48 hour(s))  Save Smear (SSMR)     Status: None   Collection Time: 09/28/20  3:02 PM  Result Value Ref Range   Smear Review SMEAR STAINED AND AVAILABLE FOR REVIEW     Comment: Performed at [facility] at [facility], [address], [place], [place]  CBC with Differential (Cancer Center Only)     Status: Abnormal   Collection Time: 09/28/20  3:02 PM  Result Value Ref Range    WBC Count 6.1 4.0 - 10.5 K/uL   RBC 4.48 3.87 - 5.11 MIL/uL   Hemoglobin 9.8 (L) 12.0 - 15.0 g/dL    Comment: Reticulocyte Hemoglobin testing may be clinically indicated, consider ordering this additional test 09/30/20    HCT 33.1 (L) 36.0 - 46.0 %   MCV 73.9 (L) 80.0 - 100.0 fL   MCH 21.9 (L) 26.0 - 34.0 pg   MCHC 29.6 (L) 30.0 - 36.0 g/dL   RDW [id number] (H) 74.2 - 59.5 %   Platelet Count 209 150 - 400 K/uL   nRBC 0.0 0.0 - 0.2 %  Neutrophils Relative % 42 %   Neutro Abs 2.6 1.7 - 7.7 K/uL   Lymphocytes Relative 43 %   Lymphs Abs 2.7 0.7 - 4.0 K/uL   Monocytes Relative 8 %   Monocytes Absolute 0.5 0.1 - 1.0 K/uL   Eosinophils Relative 5 %   Eosinophils Absolute 0.3 0.0 - 0.5 K/uL   Basophils Relative 1 %   Basophils Absolute 0.1 0.0 - 0.1 K/uL   Immature Granulocytes 1 %   Abs Immature Granulocytes 0.04 0.00 - 0.07 K/uL    Comment: Performed at [facility] Lab at [facility], [address] [place], [place] [address]  Reticulocytes     Status: None   Collection Time: 09/28/20  3:03 PM  Result Value Ref Range   Retic Ct Pct 1.3 0.4 - 3.1 %   RBC. 4.55 3.87 - 5.11 MIL/uL   Retic Count, Absolute 60.1 19.0 - 186.0 K/uL   Immature Retic Fract 14.8 2.3 - 15.9 %    Comment: Performed at [facility] Lab at [facility], [address], [place], [place] [address]      RADIOGRAPHY: No results found.     PATHOLOGY: None  ASSESSMENT/PLAN: Ms. Weitzman is a very pleasant 24 yo Hispanic female with history of iron deficiency anemia.  She is symptomatic as mentioned above.  Hgb is 9.8, MCV 73, platelets 209. Iron saturation came back at 4% and ferritin 7.  We will get her set up for IV iron and plan to see her again in 6 weeks.  Hgb fractionation cascade pending along with thalassemia work-up. She may also benefit for folic acid.   All questions were answered and she is in agreement with the plan. She was encouraged to contact our  office with any questions or concerns. We can certainly see her sooner if needed.   Emeline Gins, NP

## 2020-09-29 ENCOUNTER — Telehealth: Payer: Self-pay

## 2020-09-29 DIAGNOSIS — D509 Iron deficiency anemia, unspecified: Secondary | ICD-10-CM | POA: Insufficient documentation

## 2020-09-29 DIAGNOSIS — D500 Iron deficiency anemia secondary to blood loss (chronic): Secondary | ICD-10-CM | POA: Insufficient documentation

## 2020-09-29 LAB — IRON AND TIBC
Iron: 20 ug/dL — ABNORMAL LOW (ref 41–142)
Saturation Ratios: 4 % — ABNORMAL LOW (ref 21–57)
TIBC: 558 ug/dL — ABNORMAL HIGH (ref 236–444)
UIBC: 537 ug/dL — ABNORMAL HIGH (ref 120–384)

## 2020-09-29 LAB — ERYTHROPOIETIN: Erythropoietin: 21.5 m[IU]/mL — ABNORMAL HIGH (ref 2.6–18.5)

## 2020-09-29 LAB — FERRITIN: Ferritin: 7 ng/mL — ABNORMAL LOW (ref 11–307)

## 2020-09-29 NOTE — Telephone Encounter (Signed)
S/w pt per 12/21 inbasket message and she is aware of all of her appts... AOM

## 2020-09-29 NOTE — Telephone Encounter (Signed)
No 09/28/20 los entered... AOM 

## 2020-09-30 LAB — HGB FRACTIONATION CASCADE
Hgb A2: 2 % (ref 1.8–3.2)
Hgb A: 98 % (ref 96.4–98.8)
Hgb F: 0 % (ref 0.0–2.0)
Hgb S: 0 %

## 2020-10-07 ENCOUNTER — Inpatient Hospital Stay: Payer: 59

## 2020-10-07 ENCOUNTER — Other Ambulatory Visit: Payer: Self-pay

## 2020-10-07 ENCOUNTER — Ambulatory Visit: Payer: 59 | Admitting: Family Medicine

## 2020-10-07 VITALS — BP 113/54 | HR 71 | Temp 97.7°F | Resp 16

## 2020-10-07 DIAGNOSIS — D500 Iron deficiency anemia secondary to blood loss (chronic): Secondary | ICD-10-CM

## 2020-10-07 DIAGNOSIS — D509 Iron deficiency anemia, unspecified: Secondary | ICD-10-CM | POA: Diagnosis not present

## 2020-10-07 MED ORDER — SODIUM CHLORIDE 0.9 % IV SOLN
Freq: Once | INTRAVENOUS | Status: AC
  Filled 2020-10-07: qty 250

## 2020-10-07 MED ORDER — SODIUM CHLORIDE 0.9 % IV SOLN
200.0000 mg | Freq: Once | INTRAVENOUS | Status: AC
  Administered 2020-10-07: 200 mg via INTRAVENOUS
  Filled 2020-10-07: qty 200

## 2020-10-07 NOTE — Patient Instructions (Addendum)

## 2020-10-08 LAB — ALPHA-THALASSEMIA GENOTYPR

## 2020-10-11 ENCOUNTER — Encounter: Payer: Self-pay | Admitting: Family

## 2020-10-13 ENCOUNTER — Other Ambulatory Visit: Payer: Self-pay

## 2020-10-13 ENCOUNTER — Inpatient Hospital Stay: Payer: 59

## 2020-10-13 ENCOUNTER — Encounter: Payer: Self-pay | Admitting: Family

## 2020-10-13 ENCOUNTER — Other Ambulatory Visit (HOSPITAL_COMMUNITY)
Admission: RE | Admit: 2020-10-13 | Discharge: 2020-10-13 | Disposition: A | Discharge status: Home / Self Care | Payer: 59 | Source: Ambulatory Visit | Attending: Family | Admitting: Family

## 2020-10-13 ENCOUNTER — Ambulatory Visit (INDEPENDENT_AMBULATORY_CARE_PROVIDER_SITE_OTHER): Payer: 59 | Admitting: Family

## 2020-10-13 VITALS — BP 130/84 | HR 114 | Temp 97.9°F | Resp 16 | Wt 181.0 lb

## 2020-10-13 DIAGNOSIS — Z113 Encounter for screening for infections with a predominantly sexual mode of transmission: Secondary | ICD-10-CM | POA: Diagnosis not present

## 2020-10-13 DIAGNOSIS — N766 Ulceration of vulva: Secondary | ICD-10-CM

## 2020-10-13 MED ORDER — VALACYCLOVIR HCL 1 G PO TABS: 1000.0000 mg | ORAL_TABLET | Freq: Two times a day (BID) | ORAL | 0 refills | Status: DC

## 2020-10-13 NOTE — Patient Instructions (Signed)
Please begin Valtrex twice daily. Complete lab work prior to leaving.   Genital Herpes Genital herpes is a common sexually transmitted infection (STI) that is caused by a virus. The virus spreads from person to person through sexual contact. Infection can cause itching, blisters, and sores around the genitals or rectum. Symptoms may last several days and then go away This is called an outbreak. However, the virus remains in your body, so you may have more outbreaks in the future. The time between outbreaks varies and can be months or years. Genital herpes affects men and women. It is particularly concerning for pregnant women because the virus can be passed to the baby during delivery and can cause serious problems. Genital herpes is also a concern for people who have a weak disease-fighting (immune) system. What are the causes? This condition is caused by the herpes simplex virus (HSV) type 1 or type 2. The virus may spread through:  Sexual contact with an infected person, including vaginal, anal, and oral sex.  Contact with fluid from a herpes sore.  The skin. This means that you can get herpes from an infected partner even if he or she does not have a visible sore or does not know that he or she is infected. What increases the risk? You are more likely to develop this condition if:  You have sex with many partners.  You do not use latex condoms during sex. What are the signs or symptoms? Most people do not have symptoms (asymptomatic) or have mild symptoms that may be mistaken for other skin problems. Symptoms may include:  Small red bumps near the genitals, rectum, or mouth. These bumps turn into blisters and then turn into sores.  Flu-like symptoms, including: ? Fever. ? Body aches. ? Swollen lymph nodes. ? Headache.  Painful urination.  Pain and itching in the genital area or rectal area.  Vaginal discharge.  Tingling or shooting pain in the legs and buttocks. Generally,  symptoms are more severe and last longer during the first (primary) outbreak. Flu-like symptoms are also more common during the primary outbreak. How is this diagnosed? Genital herpes may be diagnosed based on:  A physical exam.  Your medical history.  Blood tests.  A test of a fluid sample (culture) from an open sore. How is this treated? There is no cure for this condition, but treatment with antiviral medicines that are taken by mouth (orally) can do the following:  Speed up healing and relieve symptoms.  Help to reduce the spread of the virus to sexual partners.  Limit the chance of future outbreaks, or make future outbreaks shorter.  Lessen symptoms of future outbreaks. Your health care provider may also recommend pain relief medicines, such as aspirin or ibuprofen. Follow these instructions at home: Sexual activity  Do not have sexual contact during active outbreaks.  Practice safe sex. Latex condoms and female condoms may help prevent the spread of the herpes virus. General instructions  Keep the affected areas dry and clean.  Take over-the-counter and prescription medicines only as told by your health care provider.  Avoid rubbing or touching blisters and sores. If you do touch blisters or sores: ? Wash your hands thoroughly with soap and water. ? Do not touch your eyes afterward.  To help relieve pain or itching, you may take the following actions as directed by your health care provider: ? Apply a cold, wet cloth (cold compress) to affected areas 4-6 times a day. ? Apply a substance  that protects your skin and reduces bleeding (astringent). ? Apply a gel that helps relieve pain around sores (lidocaine gel). ? Take a warm, shallow bath that cleans the genital area (sitz bath).  Keep all follow-up visits as told by your health care provider. This is important. How is this prevented?  Use condoms. Although anyone can get genital herpes during sexual contact, even  with the use of a condom, a condom can provide some protection.  Avoid having multiple sexual partners.  Talk with your sexual partner about any symptoms either of you may have. Also, talk with your partner about any history of STIs.  Get tested for STIs before you have sex. Ask your partner to do the same.  Do not have sexual contact if you have symptoms of genital herpes. Contact a health care provider if:  Your symptoms are not improving with medicine.  Your symptoms return.  You have new symptoms.  You have a fever.  You have abdominal pain.  You have redness, swelling, or pain in your eye.  You notice new sores on other parts of your body.  You are a woman and experience bleeding between menstrual periods.  You have had herpes and you become pregnant or plan to become pregnant. Summary  Genital herpes is a common sexually transmitted infection (STI) that is caused by the herpes simplex virus (HSV) type 1 or type 2.  These viruses are most often spread through sexual contact with an infected person.  You are more likely to develop this condition if you have sex with many partners or you have unprotected sex.  Most people do not have symptoms (asymptomatic) or have mild symptoms that may be mistaken for other skin problems. Symptoms occur as outbreaks that may happen months or years apart.  There is no cure for this condition, but treatment with oral antiviral medicines can reduce symptoms, reduce the chance of spreading the virus to a partner, prevent future outbreaks, or shorten future outbreaks. This information is not intended to replace advice given to you by your health care provider. Make sure you discuss any questions you have with your health care provider. Document Revised: 04/02/2018 Document Reviewed: 08/26/2016 Elsevier Patient Education  2020 [employer].

## 2020-10-13 NOTE — Addendum Note (Signed)
Addended by: Wilford Corner on: 10/13/2020 03:20 PM   Modules accepted: Orders

## 2020-10-13 NOTE — Progress Notes (Signed)
Subjective:    Patient ID: Rebekah Carter, female    DOB: 1996-01-09, 25 y.o.   MRN: [id number]  HPI  Patient is a 25 yr old female who presents today with chief complaint of vaginal irritation.  Notes that she has an ulcer on the right labia which is tender.  Has burning off affected area when she urinates. She has been having unprotected sex with the same female partner as the have been trying to conceive. States that he is the only partner that she has ever had and that he was previously tested and found to be negative for HSV2.  Denies vaginal discharge currently.    Review of Systems See HPI  Past Medical History:  Diagnosis Date  . Anemia   . Anxiety   . Asthma    as a child   . Refusal of blood transfusions as patient is Jehovah's Witness 12/17/2019  . Seizures (HCC)    last seizure 8 years ago      Social History   Socioeconomic History  . Marital status: Single    Spouse name: Not on file  . Number of children: Not on file  . Years of education: Not on file  . Highest education level: Not on file  Occupational History  . Not on file  Tobacco Use  . Smoking status: Never Smoker  . Smokeless tobacco: Never Used  Vaping Use  . Vaping Use: Never used  Substance and Sexual Activity  . Alcohol use: No  . Drug use: No  . Sexual activity: Never  Other Topics Concern  . 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         Social Determinants of Health   Financial Resource Strain: Not on file  Food Insecurity: Not on file  Transportation Needs: Not on file  Physical Activity: Not on file  Stress: Not on file  Social Connections: Not on file  Intimate Partner Violence: Not on file    Past Surgical History:  Procedure Laterality Date  . LUMBAR LAMINECTOMY/DECOMPRESSION MICRODISCECTOMY Right 12/02/2015   Procedure: CENTRAL DECOMPRESSION L5 - S1 AND L4 - L5 FOR  SPINAL STENOSIS, MICRODISCECTOMY L4-L5, L5-S1, FORAMINOTOMIES L5 ADN S1 ROOT ON THE RIGHT;  Surgeon: Ranee Gosselin, MD;  Location: [facility];  Service: Orthopedics;  Laterality: Right;  . TONSILLECTOMY AND ADENOIDECTOMY  2004    Family History  Problem Relation Age of Onset  . Diabetes Maternal Grandfather   . Hypertension Maternal Grandfather   . Diabetes Paternal Grandfather   . Hypertension Paternal Grandfather     Allergies  Allergen Reactions  . Amoxicillin Rash and Other (See Comments)    REACTION: seizures  . Latex Rash  . Other Other (See Comments)    Patient is a Scientist, product/process development   . Peanut-Containing Drug Products Itching, Swelling and Rash    Current Outpatient Medications on File Prior to Visit  Medication Sig Dispense Refill  . ferrous sulfate 325 (65 FE) MG tablet Take 325 mg by mouth 2 (two) times daily with a meal.    . norelgestromin-ethinyl estradiol [employer]) 150-35 MCG/24HR transdermal patch Place 1 patch onto the skin once a week. 3 patch 12  . sertraline (ZOLOFT) 25 MG tablet Take 25 mg by mouth at bedtime.     No current facility-administered medications on file prior to visit.  BP 130/84 (BP Location: Left Arm, Patient Position: Sitting, Cuff Size: Small)   Pulse (!) 114   Temp 97.9 F (36.6 C) (Temporal)   Resp 16   Wt 181 lb (82.1 kg)   SpO2 100%   BMI 29.21 kg/m       Objective:   Physical Exam Constitutional:      Appearance: She is well-developed and well-nourished.  Neck:     Thyroid: No thyromegaly.  Pulmonary:     Effort: Pulmonary effort is normal. No respiratory distress.     Breath sounds: No wheezing.  Genitourinary:    Comments: multiple tender ulcers noted on right labia Skin:    General: Skin is warm and dry.  Neurological:     Mental Status: She is alert and oriented to person, place, and time.  Psychiatric:        Mood and Affect: Mood and affect normal.        Behavior: Behavior normal.        Thought Content:  Thought content normal.        Judgment: Judgment normal.           Assessment & Plan:  Genital ulcers- suspect genital herpes.  Will rx with Valtrex. Viral culture obtained today. Will also send out HSV2 IgM/IgG, HSV1, RPR, HIV.  Counseling and support provided.  Discussed importance of safe sex.   This visit occurred during the SARS-CoV-2 public health emergency.  Safety protocols were in place, including screening questions prior to the visit, additional usage of staff PPE, and extensive cleaning of exam room while observing appropriate contact time as indicated for disinfecting solutions.

## 2020-10-15 ENCOUNTER — Telehealth: Payer: Self-pay | Admitting: Family

## 2020-10-15 ENCOUNTER — Inpatient Hospital Stay: Payer: 59 | Attending: Hematology & Oncology

## 2020-10-15 ENCOUNTER — Other Ambulatory Visit: Payer: Self-pay

## 2020-10-15 VITALS — BP 107/66 | HR 77 | Temp 98.4°F | Resp 16

## 2020-10-15 DIAGNOSIS — L989 Disorder of the skin and subcutaneous tissue, unspecified: Secondary | ICD-10-CM

## 2020-10-15 DIAGNOSIS — D500 Iron deficiency anemia secondary to blood loss (chronic): Secondary | ICD-10-CM

## 2020-10-15 DIAGNOSIS — D509 Iron deficiency anemia, unspecified: Secondary | ICD-10-CM | POA: Diagnosis not present

## 2020-10-15 LAB — CERVICOVAGINAL ANCILLARY ONLY
Chlamydia: NEGATIVE
Comment: NEGATIVE
Comment: NEGATIVE
Comment: NORMAL
Neisseria Gonorrhea: NEGATIVE
Trichomonas: NEGATIVE

## 2020-10-15 LAB — HERPES SIMPLEX VIRUS CULTURE
MICRO NUMBER:: 11380099
SPECIMEN QUALITY:: ADEQUATE

## 2020-10-15 LAB — HSV(HERPES SIMPLEX VRS) I + II AB-IGM: HSVI/II Comb IgM: 1.97 Ratio — ABNORMAL HIGH (ref 0.00–0.90)

## 2020-10-15 LAB — HSV 1 ANTIBODY, IGG: HSV 1 Glycoprotein G Ab, IgG: <0.9 index

## 2020-10-15 LAB — HSV 2 ANTIBODY, IGG: HSV 2 Glycoprotein G Ab, IgG: <0.9 index

## 2020-10-15 LAB — HEPATITIS C ANTIBODY
Hepatitis C Ab: NONREACTIVE
SIGNAL TO CUT-OFF: 0.02 (ref <1.00)

## 2020-10-15 LAB — RPR: RPR Ser Ql: NONREACTIVE

## 2020-10-15 LAB — HIV ANTIBODY (ROUTINE TESTING W REFLEX): HIV 1&2 Ab, 4th Generation: NONREACTIVE

## 2020-10-15 LAB — HEPATITIS B SURFACE ANTIGEN: Hepatitis B Surface Ag: NONREACTIVE

## 2020-10-15 MED ORDER — SODIUM CHLORIDE 0.9 % IV SOLN
200.0000 mg | Freq: Once | INTRAVENOUS | Status: AC
  Administered 2020-10-15: 200 mg via INTRAVENOUS
  Filled 2020-10-15: qty 200

## 2020-10-15 MED ORDER — SODIUM CHLORIDE 0.9 % IV SOLN
Freq: Once | INTRAVENOUS | Status: AC
  Filled 2020-10-15: qty 250

## 2020-10-15 NOTE — Patient Instructions (Signed)

## 2020-10-15 NOTE — Telephone Encounter (Signed)
See mychart.  

## 2020-10-19 ENCOUNTER — Other Ambulatory Visit (HOSPITAL_BASED_OUTPATIENT_CLINIC_OR_DEPARTMENT_OTHER): Payer: Self-pay | Admitting: Internal Medicine

## 2020-10-19 ENCOUNTER — Inpatient Hospital Stay: Payer: 59

## 2020-10-19 ENCOUNTER — Ambulatory Visit: Payer: 59 | Attending: Internal Medicine

## 2020-10-19 ENCOUNTER — Other Ambulatory Visit: Payer: Self-pay

## 2020-10-19 VITALS — BP 113/76 | HR 72 | Temp 98.6°F | Resp 16

## 2020-10-19 DIAGNOSIS — D500 Iron deficiency anemia secondary to blood loss (chronic): Secondary | ICD-10-CM

## 2020-10-19 DIAGNOSIS — D509 Iron deficiency anemia, unspecified: Secondary | ICD-10-CM | POA: Diagnosis not present

## 2020-10-19 DIAGNOSIS — Z23 Encounter for immunization: Secondary | ICD-10-CM

## 2020-10-19 MED ORDER — SODIUM CHLORIDE 0.9 % IV SOLN
200.0000 mg | Freq: Once | INTRAVENOUS | Status: AC
  Administered 2020-10-19: 200 mg via INTRAVENOUS
  Filled 2020-10-19: qty 200

## 2020-10-19 MED ORDER — FLUCONAZOLE 150 MG PO TABS: ORAL_TABLET | ORAL | 0 refills | Status: DC

## 2020-10-19 MED ORDER — SODIUM CHLORIDE 0.9 % IV SOLN
Freq: Once | INTRAVENOUS | Status: AC
  Filled 2020-10-19: qty 250

## 2020-10-19 NOTE — Patient Instructions (Signed)

## 2020-10-19 NOTE — Progress Notes (Signed)
   Covid-19 Vaccination Clinic  Name:  Rebekah Carter    MRN: [id number] DOB: [birth year]  10/19/2020  [name] was observed post Covid-19 immunization for 15 minutes without incident. She was provided with Vaccine Information Sheet and instruction to access the V-Safe system.   [name] was instructed to call 911 with any severe reactions post vaccine: Marland Kitchen Difficulty breathing  . Swelling of face and throat  . A fast heartbeat  . A bad rash all over body  . Dizziness and weakness   Immunizations Administered    Name Date Dose VIS Date Route   Pfizer COVID-19 Vaccine 10/19/2020 12:11 PM 0.3 mL 07/29/2020 Intramuscular   Manufacturer: [employer], [employer]   Lot: [id number]   NDC: [address]-1

## 2020-10-19 NOTE — Addendum Note (Signed)
Addended by: Sandford Craze on: 10/19/2020 01:23 PM   Modules accepted: Orders

## 2020-10-20 MED FILL — PFIZER-BIONTECH COVID-19 VA: 30 | 21 days supply | Qty: 0 | Fill #0

## 2020-10-22 ENCOUNTER — Other Ambulatory Visit: Payer: Self-pay

## 2020-10-22 ENCOUNTER — Inpatient Hospital Stay: Payer: 59

## 2020-10-22 VITALS — BP 124/58 | HR 70 | Temp 98.3°F | Resp 16

## 2020-10-22 DIAGNOSIS — D500 Iron deficiency anemia secondary to blood loss (chronic): Secondary | ICD-10-CM

## 2020-10-22 DIAGNOSIS — D509 Iron deficiency anemia, unspecified: Secondary | ICD-10-CM | POA: Diagnosis not present

## 2020-10-22 MED ORDER — SODIUM CHLORIDE 0.9 % IV SOLN
Freq: Once | INTRAVENOUS | Status: AC
  Filled 2020-10-22: qty 250

## 2020-10-22 MED ORDER — SODIUM CHLORIDE 0.9 % IV SOLN
200.0000 mg | Freq: Once | INTRAVENOUS | Status: AC
  Administered 2020-10-22: 200 mg via INTRAVENOUS
  Filled 2020-10-22: qty 200

## 2020-10-22 NOTE — Patient Instructions (Signed)

## 2020-10-23 NOTE — Addendum Note (Signed)
Addended by: Sandford Craze on: 10/23/2020 07:10 AM   Modules accepted: Orders

## 2020-10-29 ENCOUNTER — Ambulatory Visit: Payer: 59 | Admitting: Family Medicine

## 2020-11-03 MED ORDER — VALACYCLOVIR HCL 1 G PO TABS
1000.0000 mg | ORAL_TABLET | Freq: Two times a day (BID) | ORAL | 0 refills | Status: DC
Start: 2020-11-03 — End: 2020-11-16

## 2020-11-03 NOTE — Addendum Note (Signed)
Addended by: Sandford Craze on: 11/03/2020 09:03 AM   Modules accepted: Orders

## 2020-11-09 ENCOUNTER — Ambulatory Visit: Payer: Self-pay

## 2020-11-10 ENCOUNTER — Telehealth: Payer: Self-pay

## 2020-11-10 NOTE — Telephone Encounter (Signed)
Called pt to r/s her 11/16/20 as Maralyn Sago is out of the office, left a vm with a new appt   Claude Waldman

## 2020-11-16 ENCOUNTER — Ambulatory Visit: Payer: 59 | Admitting: Family

## 2020-11-16 ENCOUNTER — Other Ambulatory Visit: Payer: 59

## 2020-11-16 MED ORDER — VALACYCLOVIR HCL 1 G PO TABS: 1000.0000 mg | ORAL_TABLET | Freq: Every day | ORAL | 1 refills | Status: DC

## 2020-11-24 ENCOUNTER — Inpatient Hospital Stay: Payer: 59 | Attending: Hematology & Oncology

## 2020-11-24 ENCOUNTER — Inpatient Hospital Stay: Payer: 59 | Admitting: Family

## 2020-11-25 ENCOUNTER — Ambulatory Visit: Payer: 59 | Admitting: Family

## 2020-11-30 ENCOUNTER — Ambulatory Visit (INDEPENDENT_AMBULATORY_CARE_PROVIDER_SITE_OTHER): Payer: 59 | Admitting: Family

## 2020-11-30 ENCOUNTER — Encounter: Payer: Self-pay | Admitting: Family

## 2020-11-30 ENCOUNTER — Other Ambulatory Visit: Payer: Self-pay

## 2020-11-30 VITALS — BP 115/63 | HR 61 | Temp 97.7°F | Resp 16 | Ht 66.0 in | Wt 186.0 lb

## 2020-11-30 DIAGNOSIS — A6000 Herpesviral infection of urogenital system, unspecified: Secondary | ICD-10-CM

## 2020-11-30 DIAGNOSIS — F419 Anxiety disorder, unspecified: Secondary | ICD-10-CM | POA: Diagnosis not present

## 2020-11-30 DIAGNOSIS — D509 Iron deficiency anemia, unspecified: Secondary | ICD-10-CM | POA: Diagnosis not present

## 2020-11-30 NOTE — Progress Notes (Signed)
Subjective:    Patient ID: Rebekah Carter, female    DOB: Apr 03, 1996, 25 y.o.   MRN: [id number]  HPI  Patient is a 25 yr old female who presents today for follow up. Iron deficiency anemia- She is receiving IV iron infusions from Hematology. She follows back up with them on 12/08/20. She reports that she is feeling much better since starting IV iron with more energy.  Lab Results  Component Value Date   WBC 6.1 09/28/2020   HGB 9.8 (L) 09/28/2020   HCT 33.1 (L) 09/28/2020   MCV 73.9 (L) 09/28/2020   PLT 209 09/28/2020   Anxiety-She is maintained on zoloft 25mg .  This is being prescribed by psychiatry. Helpful and also helping with sleep.    HSV2- continues valtrex once daily.  She has had no further breakouts since she began valtrex.   Review of Systems    see HPI  Past Medical History:  Diagnosis Date  . Anemia   . Anxiety   . Asthma    as a child   . Refusal of blood transfusions as patient is Jehovah's Witness 12/17/2019  . Seizures (HCC)    last seizure 8 years ago      Social History   Socioeconomic History  . Marital status: Single    Spouse name: Not on file  . Number of children: Not on file  . Years of education: Not on file  . Highest education level: Not on file  Occupational History  . Not on file  Tobacco Use  . Smoking status: Never Smoker  . Smokeless tobacco: Never Used  Vaping Use  . Vaping Use: Never used  Substance and Sexual Activity  . Alcohol use: No  . Drug use: No  . Sexual activity: Never  Other Topics Concern  . Not on file  Social History Narrative   Works as a 02/16/2020 in [place] at a [employer] with her parents   Only child   Enjoys shopping, sleep.         Social Determinants of Health   Financial Resource Strain: Not on file  Food Insecurity: Not on file  Transportation Needs: Not on file  Physical Activity: Not on file  Stress: Not on file  Social Connections: Not on file  Intimate Partner Violence: Not on file     Past Surgical History:  Procedure Laterality Date  . LUMBAR LAMINECTOMY/DECOMPRESSION MICRODISCECTOMY Right 12/02/2015   Procedure: CENTRAL DECOMPRESSION L5 - S1 AND L4 - L5 FOR SPINAL STENOSIS, MICRODISCECTOMY L4-L5, L5-S1, FORAMINOTOMIES L5 ADN S1 ROOT ON THE RIGHT;  Surgeon: 12/04/2015, MD;  Location: [facility];  Service: Orthopedics;  Laterality: Right;  . TONSILLECTOMY AND ADENOIDECTOMY  2004    Family History  Problem Relation Age of Onset  . Diabetes Maternal Grandfather   . Hypertension Maternal Grandfather   . Diabetes Paternal Grandfather   . Hypertension Paternal Grandfather     Allergies  Allergen Reactions  . Amoxicillin Rash and Other (See Comments)    REACTION: seizures  . Latex Rash  . Other Other (See Comments)    Patient is a 2007   . Peanut-Containing Drug Products Itching, Swelling and Rash    Current Outpatient Medications on File Prior to Visit  Medication Sig Dispense Refill  . sertraline (ZOLOFT) 25 MG tablet Take 25 mg by mouth at bedtime.    . valACYclovir (VALTREX) 1000 MG tablet Take 1 tablet (1,000 mg total) by mouth daily. 90 tablet 1  No current facility-administered medications on file prior to visit.    BP 115/63 (BP Location: Left Arm, Patient Position: Sitting, Cuff Size: Small)   Pulse 61   Temp 97.7 F (36.5 C) (Oral)   Resp 16   Ht 5\' 6"  (1.676 m)   Wt 186 lb (84.4 kg)   SpO2 98%   BMI 30.02 kg/m    Objective:   Physical Exam Constitutional:      Appearance: She is well-developed and well-nourished.  Cardiovascular:     Rate and Rhythm: Normal rate and regular rhythm.     Heart sounds: Normal heart sounds. No murmur heard.   Pulmonary:     Effort: Pulmonary effort is normal. No respiratory distress.     Breath sounds: Normal breath sounds. No wheezing.  Psychiatric:        Mood and Affect: Mood and affect normal.        Behavior: Behavior normal.        Thought Content: Thought content normal.         Judgment: Judgment normal.           Assessment & Plan:  Iron deficiency anemia- clinically stable/improved. Management per hematology.  Anxiety- stable on zoloft.  Management per psychiatry.  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 Continue same.   This visit occurred during the SARS-CoV-2 public health emergency.  Safety protocols were in place, including screening questions prior to the visit, additional usage of staff PPE, and extensive cleaning of exam room while observing appropriate contact time as indicated for disinfecting solutions.

## 2020-12-01 ENCOUNTER — Encounter: Payer: Self-pay | Admitting: General Practice

## 2020-12-01 ENCOUNTER — Telehealth: Payer: Self-pay | Admitting: General Practice

## 2020-12-01 NOTE — Telephone Encounter (Signed)
Left message on VM for patient to give our office a call to schedule 11 month f/u with Dr. Erin Fulling in April.  Letter sent to patient via Mychart.

## 2020-12-08 ENCOUNTER — Inpatient Hospital Stay: Payer: 59 | Attending: Hematology & Oncology

## 2020-12-08 ENCOUNTER — Inpatient Hospital Stay: Payer: 59 | Admitting: Family

## 2021-01-08 ENCOUNTER — Encounter: Payer: Self-pay | Admitting: Family

## 2021-01-11 ENCOUNTER — Encounter: Payer: Self-pay | Admitting: Family

## 2021-01-11 ENCOUNTER — Other Ambulatory Visit: Payer: Self-pay

## 2021-01-11 ENCOUNTER — Ambulatory Visit (INDEPENDENT_AMBULATORY_CARE_PROVIDER_SITE_OTHER): Payer: 59 | Admitting: Family

## 2021-01-11 ENCOUNTER — Telehealth: Payer: Self-pay | Admitting: Family

## 2021-01-11 ENCOUNTER — Ambulatory Visit: Payer: 59 | Attending: Internal Medicine

## 2021-01-11 VITALS — BP 120/63 | HR 73 | Temp 98.2°F | Resp 16 | Ht 66.0 in | Wt 184.0 lb

## 2021-01-11 DIAGNOSIS — D509 Iron deficiency anemia, unspecified: Secondary | ICD-10-CM

## 2021-01-11 DIAGNOSIS — Z0000 Encounter for general adult medical examination without abnormal findings: Secondary | ICD-10-CM

## 2021-01-11 DIAGNOSIS — Z23 Encounter for immunization: Secondary | ICD-10-CM

## 2021-01-11 DIAGNOSIS — F419 Anxiety disorder, unspecified: Secondary | ICD-10-CM

## 2021-01-11 DIAGNOSIS — A6000 Herpesviral infection of urogenital system, unspecified: Secondary | ICD-10-CM | POA: Diagnosis not present

## 2021-01-11 DIAGNOSIS — R739 Hyperglycemia, unspecified: Secondary | ICD-10-CM | POA: Diagnosis not present

## 2021-01-11 LAB — IRON: Iron: 69 ug/dL (ref 42–145)

## 2021-01-11 LAB — COMPREHENSIVE METABOLIC PANEL
ALT: 17 U/L (ref 0–35)
AST: 15 U/L (ref 0–37)
Albumin: 4.5 g/dL (ref 3.5–5.2)
Alkaline Phosphatase: 71 U/L (ref 39–117)
BUN: 13 mg/dL (ref 6–23)
CO2: 24 mEq/L (ref 19–32)
Calcium: 9.1 mg/dL (ref 8.4–10.5)
Chloride: 105 mEq/L (ref 96–112)
Creatinine, Ser: 0.63 mg/dL (ref 0.40–1.20)
GFR: 122.43 mL/min (ref >60.00)
Glucose, Bld: 93 mg/dL (ref 70–99)
Potassium: 4.1 mEq/L (ref 3.5–5.1)
Sodium: 137 mEq/L (ref 135–145)
Total Bilirubin: 0.4 mg/dL (ref 0.2–1.2)
Total Protein: 7.2 g/dL (ref 6.0–8.3)

## 2021-01-11 LAB — CBC WITH DIFFERENTIAL/PLATELET
Basophils Absolute: 0 10*3/uL (ref 0.0–0.1)
Basophils Relative: 0.6 % (ref 0.0–3.0)
Eosinophils Absolute: 0.3 10*3/uL (ref 0.0–0.7)
Eosinophils Relative: 3.2 % (ref 0.0–5.0)
HCT: 38 % (ref 36.0–46.0)
Hemoglobin: 13 g/dL (ref 12.0–15.0)
Lymphocytes Relative: 29.1 % (ref 12.0–46.0)
Lymphs Abs: 2.3 10*3/uL (ref 0.7–4.0)
MCHC: 34.1 g/dL (ref 30.0–36.0)
MCV: 85.8 fl (ref 78.0–100.0)
Monocytes Absolute: 0.6 10*3/uL (ref 0.1–1.0)
Monocytes Relative: 7.1 % (ref 3.0–12.0)
Neutro Abs: 4.7 10*3/uL (ref 1.4–7.7)
Neutrophils Relative %: 60 % (ref 43.0–77.0)
Platelets: 186 10*3/uL (ref 150.0–400.0)
RBC: 4.43 Mil/uL (ref 3.87–5.11)
RDW: 21.9 % — ABNORMAL HIGH (ref 11.5–15.5)
WBC: 7.9 10*3/uL (ref 4.0–10.5)

## 2021-01-11 LAB — FERRITIN: Ferritin: 29.2 ng/mL (ref 10.0–291.0)

## 2021-01-11 MED ORDER — SERTRALINE HCL 25 MG PO TABS: 25.0000 mg | ORAL_TABLET | Freq: Every day | ORAL | 1 refills | Status: DC

## 2021-01-11 NOTE — Telephone Encounter (Signed)
See mychart.  

## 2021-01-11 NOTE — Progress Notes (Signed)
Subjective:    Patient ID: Rebekah Carter, female    DOB: 1996-03-19, 25 y.o.   MRN: [id number]  HPI  Patient presents today for complete physical.  Immunizations:  Due or Pfizer #2 Diet: trying to improve her diet and eat more at home.  Exercise: some, wants to go back to [employer] Pap Smear: 3/0/3031 Vision: 2/22 Dental: due  Wt Readings from Last 3 Encounters:  01/11/21 184 lb (83.5 kg)  11/30/20 186 lb (84.4 kg)  10/13/20 181 lb (82.1 kg)   Anxiety- Reports that she is doing well on zoloft but [facility] won't rx now that she has insurance.  She is requesting that we take over her refills.   HSV2- continues daily valtrex 1000mg  PO daily. Reports no breakouts on this regimen.  Review of Systems  Constitutional: Negative for unexpected weight change.  HENT: Negative for rhinorrhea.   Respiratory: Negative for cough.   Cardiovascular: Negative for leg swelling.  Gastrointestinal: Positive for constipation (trying to add more fiber). Negative for diarrhea, nausea and vomiting.  Genitourinary: Negative for dysuria and frequency.  Musculoskeletal: Positive for back pain (intermittent).  Skin: Negative for rash.  Neurological: Positive for headaches (related to needing to update her glasses rx).  Psychiatric/Behavioral:       Reports good mood.    Past Medical History:  Diagnosis Date  . Anemia   . Anxiety   . Asthma    as a child   . Refusal of blood transfusions as patient is Jehovah's Witness 12/17/2019  . Seizures (HCC)    last seizure 8 years ago      Social History   Socioeconomic History  . Marital status: Single    Spouse name: Not on file  . Number of children: Not on file  . Years of education: Not on file  . Highest education level: Not on file  Occupational History  . Not on file  Tobacco Use  . Smoking status: Never Smoker  . Smokeless tobacco: Never Used  Vaping Use  . Vaping Use: Never used  Substance and Sexual Activity  . Alcohol use: No   . Drug use: No  . Sexual activity: Never  Other Topics Concern  . Not on file  Social History Narrative   Works as a 02/16/2020 in [place] at a [employer] with her parents   Only child   Enjoys shopping, sleep.         Social Determinants of Health   Financial Resource Strain: Not on file  Food Insecurity: Not on file  Transportation Needs: Not on file  Physical Activity: Not on file  Stress: Not on file  Social Connections: Not on file  Intimate Partner Violence: Not on file    Past Surgical History:  Procedure Laterality Date  . LUMBAR LAMINECTOMY/DECOMPRESSION MICRODISCECTOMY Right 12/02/2015   Procedure: CENTRAL DECOMPRESSION L5 - S1 AND L4 - L5 FOR SPINAL STENOSIS, MICRODISCECTOMY L4-L5, L5-S1, FORAMINOTOMIES L5 ADN S1 ROOT ON THE RIGHT;  Surgeon: 12/04/2015, MD;  Location: [facility];  Service: Orthopedics;  Laterality: Right;  . TONSILLECTOMY AND ADENOIDECTOMY  2004    Family History  Problem Relation Age of Onset  . Diabetes Maternal Grandfather   . Hypertension Maternal Grandfather   . Diabetes Paternal Grandfather   . Hypertension Paternal Grandfather     Allergies  Allergen Reactions  . Amoxicillin Rash and Other (See Comments)    REACTION: seizures  . Latex Rash  . Other Other (See Comments)  Patient is a Scientist, product/process development   . Peanut-Containing Drug Products Itching, Swelling and Rash    Current Outpatient Medications on File Prior to Visit  Medication Sig Dispense Refill  . sertraline (ZOLOFT) 25 MG tablet Take 25 mg by mouth at bedtime.    . valACYclovir (VALTREX) 1000 MG tablet Take 1 tablet (1,000 mg total) by mouth daily. 90 tablet 1   No current facility-administered medications on file prior to visit.    BP 120/63 (BP Location: Right Arm, Patient Position: Sitting, Cuff Size: Small)   Pulse 73   Temp 98.2 F (36.8 C) (Oral)   Resp 16   Ht 5\' 6"  (1.676 m)   Wt 184 lb (83.5 kg)   SpO2 99%   BMI 29.70 kg/m       Objective:   Physical  Exam  Physical Exam  Constitutional: She is oriented to person, place, and time. She appears well-developed and well-nourished. No distress.  HENT:  Head: Normocephalic and atraumatic.  Right Ear: Tympanic membrane and ear canal normal.  Left Ear: Tympanic membrane and ear canal normal.  Mouth/Throat: not examined- pt wearing mask Eyes: Pupils are equal, round, and reactive to light. No scleral icterus.  Neck: Normal range of motion. No thyromegaly present.  Cardiovascular: Normal rate and regular rhythm.   No murmur heard. Pulmonary/Chest: Effort normal and breath sounds normal. No respiratory distress. He has no wheezes. She has no rales. She exhibits no tenderness.  Abdominal: Soft. Bowel sounds are normal. She exhibits no distension and no mass. There is no tenderness. There is no rebound and no guarding.  Musculoskeletal: She exhibits no edema.  Lymphadenopathy:    She has no cervical adenopathy.  Neurological: She is alert and oriented to person, place, and time. She has 3+ right patellar reflex, 2+ Left patellar reflex. She exhibits normal muscle tone. Coordination normal.  Skin: Skin is warm and dry.  Psychiatric: She has a normal mood and affect. Her behavior is normal. Judgment and thought content normal.  Breasts: Examined lying Right: Without masses, retractions, discharge or axillary adenopathy.  Left: Without masses, retractions, discharge or axillary adenopathy.  Pelvic: deferred         Assessment & Plan:   Preventative care- discussed healthy diet, exercise and weight loss.  Pap up to date.  Recommended that she schedule second covid vaccine.  Pap is up to date. Obtain labs as ordered. Recommended routine dental care.   Anxiety- stable on zoloft 25mg .  I advised her that I will continue to prescribe zoloft as long as she remains stable- otherwise would need to get her in with another psychiatrist.  HSV2- stable. Continue valtrex 1000mg  PO daily.  Iron  deficiency anemia- check follow up cbc, iron studies. Advised pt to schedule a follow up appointment with hematology.  This visit occurred during the SARS-CoV-2 public health emergency.  Safety protocols were in place, including screening questions prior to the visit, additional usage of staff PPE, and extensive cleaning of exam room while observing appropriate contact time as indicated for disinfecting solutions.        Assessment & Plan:

## 2021-01-11 NOTE — Patient Instructions (Addendum)
Please get your second covid vaccine and then your third vaccine 6 months after the second. Please schedule a routine dental visit.  Please complete lab work prior to leaving.  Work on adding regular exercise and weight loss.

## 2021-01-11 NOTE — Telephone Encounter (Signed)
Addressed at today's visit

## 2021-01-18 ENCOUNTER — Other Ambulatory Visit (HOSPITAL_BASED_OUTPATIENT_CLINIC_OR_DEPARTMENT_OTHER): Payer: Self-pay

## 2021-01-18 MED ORDER — PFIZER-BIONT COVID-19 VAC-TRIS 30 MCG/0.3ML IM SUSP
INTRAMUSCULAR | 0 refills | Status: DC
  Filled 2021-01-18: qty 0.3, 1d supply, fill #0

## 2021-01-20 ENCOUNTER — Encounter: Payer: Self-pay | Admitting: Obstetrics & Gynecology

## 2021-01-20 ENCOUNTER — Other Ambulatory Visit: Payer: Self-pay

## 2021-01-20 ENCOUNTER — Other Ambulatory Visit (HOSPITAL_COMMUNITY)
Admission: RE | Admit: 2021-01-20 | Discharge: 2021-01-20 | Disposition: A | Discharge status: Home / Self Care | Payer: 59 | Source: Ambulatory Visit | Attending: Obstetrics & Gynecology | Admitting: Obstetrics & Gynecology

## 2021-01-20 ENCOUNTER — Ambulatory Visit (INDEPENDENT_AMBULATORY_CARE_PROVIDER_SITE_OTHER): Payer: 59 | Admitting: Obstetrics & Gynecology

## 2021-01-20 VITALS — BP 117/68 | HR 68 | Ht 66.0 in | Wt 184.0 lb

## 2021-01-20 DIAGNOSIS — Z01419 Encounter for gynecological examination (general) (routine) without abnormal findings: Secondary | ICD-10-CM | POA: Diagnosis not present

## 2021-01-20 DIAGNOSIS — Z113 Encounter for screening for infections with a predominantly sexual mode of transmission: Secondary | ICD-10-CM | POA: Diagnosis not present

## 2021-01-20 NOTE — Progress Notes (Signed)
Patient presents for annual exam

## 2021-01-20 NOTE — Progress Notes (Signed)
Subjective:     Rebekah Carter is a 25 y.o. female here for a routine exam.  Current complaints: none. Pt missed her menses last month but, it came yesterday. She is currently on her cycle. She was recently dx'd with a genital herpes outbreak and was placed on Acyclovir daily for suppression. She has had no further outbreaks.    Pt is sexually active. Female partner. Does not desire different contraception. Was prev on the contraceptive patch.    Gynecologic History Patient's last menstrual period was 01/15/2021. Contraception: condoms Last Pap: 12/17/2019 Adequacy Satisfactory for evaluation; transformation zone component PRESENT.   Diagnosis - Low grade squamous intraepithelial lesion (LSIL)Abnormal   Resulting Agency [facility]   Last mammogram: n/a  Obstetric History OB History  Gravida Para Term Preterm AB Living  0 0 0 0 0 0  SAB IAB Ectopic Multiple Live Births  0 0 0 0 0     The following portions of the patient's history were reviewed and updated as appropriate: allergies, current medications, past family history, past medical history, past social history, past surgical history and problem list.  Review of Systems Pertinent items are noted in HPI.    Objective:  BP 117/68   Pulse 68   Ht 5\' 6"  (1.676 m)   Wt 184 lb (83.5 kg)   LMP 01/15/2021   BMI 29.70 kg/m  General Appearance:    Alert, cooperative, no distress, appears stated age  Head:    Normocephalic, without obvious abnormality, atraumatic  Eyes:    conjunctiva/corneas clear, EOM's intact, both eyes  Ears:    Normal external ear canals, both ears  Nose:   Nares normal, septum midline, mucosa normal, no drainage    or sinus tenderness  Throat:   Lips, mucosa, and tongue normal; teeth and gums normal  Neck:   Supple, symmetrical, trachea midline, no adenopathy;    thyroid:  no enlargement/tenderness/nodules  Back:     Symmetric, no curvature, ROM normal, no CVA tenderness  Lungs:     respirations unlabored   Chest Wall:    No tenderness or deformity   Heart:    Regular rate and rhythm  Breast Exam:    No tenderness, masses, or nipple abnormality  Abdomen:     Soft, non-tender, bowel sounds active all four quadrants,    no masses, no organomegaly  Genitalia:    Normal female without lesion, discharge or tenderness   + blood in vualt. Uterus small and mobile.   Extremities:   Extremities normal, atraumatic, no cyanosis or edema  Pulses:   2+ and symmetric all extremities  Skin:   Skin color, texture, turgor normal, no rashes or lesions      Assessment:    Healthy female exam.   Genital HSV STI screen    Plan:   Rebekah Carter was seen today for gynecologic exam.  Diagnoses and all orders for this visit:  Well female exam with routine gynecological exam -     Cytology - PAP( [facility])  cx done on PAP   f/u in 1 year and sooner prn   Rebekah Carter L. Harraway-Smith, M.D., 03/17/2021

## 2021-01-25 LAB — CYTOLOGY - PAP
Chlamydia: NEGATIVE
Comment: NEGATIVE
Comment: NORMAL
Diagnosis: NEGATIVE
Neisseria Gonorrhea: NEGATIVE

## 2021-02-18 ENCOUNTER — Encounter: Payer: Self-pay | Admitting: Family

## 2021-02-22 ENCOUNTER — Other Ambulatory Visit: Payer: Self-pay

## 2021-02-22 ENCOUNTER — Ambulatory Visit (INDEPENDENT_AMBULATORY_CARE_PROVIDER_SITE_OTHER): Payer: 59 | Admitting: Family

## 2021-02-22 DIAGNOSIS — A6000 Herpesviral infection of urogenital system, unspecified: Secondary | ICD-10-CM | POA: Insufficient documentation

## 2021-02-22 DIAGNOSIS — Z30016 Encounter for initial prescription of transdermal patch hormonal contraceptive device: Secondary | ICD-10-CM

## 2021-02-22 DIAGNOSIS — Z309 Encounter for contraceptive management, unspecified: Secondary | ICD-10-CM | POA: Insufficient documentation

## 2021-02-22 DIAGNOSIS — F4010 Social phobia, unspecified: Secondary | ICD-10-CM | POA: Diagnosis not present

## 2021-02-22 DIAGNOSIS — A6004 Herpesviral vulvovaginitis: Secondary | ICD-10-CM | POA: Diagnosis not present

## 2021-02-22 LAB — POCT URINE PREGNANCY: Preg Test, Ur: NEGATIVE

## 2021-02-22 MED ORDER — NORELGESTROMIN-ETH ESTRADIOL 150-35 MCG/24HR TD PTWK: MEDICATED_PATCH | TRANSDERMAL | 3 refills | Status: DC

## 2021-02-22 NOTE — Addendum Note (Signed)
Addended by: Wilford Corner on: 02/22/2021 08:42 AM   Modules accepted: Orders

## 2021-02-22 NOTE — Assessment & Plan Note (Signed)
Pt reports stable mood on zoloft 25mg  once daily. Continue same.

## 2021-02-22 NOTE — Progress Notes (Signed)
Subjective:   By signing my name below, I, Shehryar Baig, attest that this documentation has been prepared under the direction and in the presence of Sandford Craze NP. 02/22/2021      Patient ID: Rebekah Carter, female    DOB: [birth year], 25 y.o.   MRN: [id number]  No chief complaint on file.   HPI Patient is in today for a office visit.   Contraceptive management-She is interested in taking birth control medication. Her last menstrual cycle was on 02/12/2021. She took plan B last month and her most recent period lasted 10 days. during that time. She is interested in taking a birth control patch which she has taken before. States that she has trouble remembering to take pills each day and the patch is more convenient for her.  Social Anxiety Disorder- reports symptoms stable on zoloft 25mg  once daily.  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 She denies any breakouts while on this regimen.    Past Medical History:  Diagnosis Date  . Anemia   . Anxiety   . Asthma    as a child   . Refusal of blood transfusions as patient is Jehovah's Witness 12/17/2019  . Seizures (HCC)    last seizure 8 years ago     Past Surgical History:  Procedure Laterality Date  . LUMBAR LAMINECTOMY/DECOMPRESSION MICRODISCECTOMY Right 12/02/2015   Procedure: CENTRAL DECOMPRESSION L5 - S1 AND L4 - L5 FOR SPINAL STENOSIS, MICRODISCECTOMY L4-L5, L5-S1, FORAMINOTOMIES L5 ADN S1 ROOT ON THE RIGHT;  Surgeon: 02/16/2020, MD;  Location: [facility];  Service: Orthopedics;  Laterality: Right;  . TONSILLECTOMY AND ADENOIDECTOMY  2004    Family History  Problem Relation Age of Onset  . Diabetes Maternal Grandfather   . Hypertension Maternal Grandfather   . Diabetes Paternal Grandfather   . Hypertension Paternal Grandfather     Social History   Socioeconomic History  . Marital status: Single    Spouse name: Not on file  . Number of children: Not on file  . Years of  education: Not on file  . Highest education level: High school graduate  Occupational History  . Not on file  Tobacco Use  . Smoking status: Never Smoker  . Smokeless tobacco: Never Used  Vaping Use  . Vaping Use: Never used  Substance and Sexual Activity  . Alcohol use: No  . Drug use: No  . Sexual activity: Never    Birth control/protection: None  Other Topics Concern  . 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      Social Determinants of Health   Financial Resource Strain: Not on file  Food Insecurity: Not on file  Transportation Needs: Not on file  Physical Activity: Not on file  Stress: Not on file  Social Connections: Not on file  Intimate Partner Violence: Not on file    Outpatient Medications Prior to Visit  Medication Sig Dispense Refill  . COVID-19 mRNA Vac-TriS, Pfizer, (PFIZER-BIONT COVID-19 VAC-TRIS) SUSP injection Inject into the muscle. 0.3 mL 0  . sertraline (ZOLOFT) 25 MG tablet Take 1 tablet (25 mg total) by mouth at bedtime. 90 tablet 1  . valACYclovir (VALTREX) 1000 MG tablet Take 1 tablet (1,000 mg total) by mouth daily. 90 tablet 1   No facility-administered medications prior to visit.  Allergies  Allergen Reactions  . Amoxicillin Rash and Other (See Comments)    REACTION: seizures  . Latex Rash  . Other Other (See Comments)    Patient is a Scientist, product/process development   . Peanut-Containing Drug Products Itching, Swelling and Rash    ROS     Objective:    Physical Exam Constitutional:      Appearance: Normal appearance.  HENT:     Head: Normocephalic and atraumatic.     Right Ear: External ear normal.     Left Ear: External ear normal.  Eyes:     Extraocular Movements: Extraocular movements intact.     Pupils: Pupils are equal, round, and reactive to light.  Cardiovascular:     Rate and Rhythm: Normal rate  and regular rhythm.     Pulses: Normal pulses.     Heart sounds: Normal heart sounds. No murmur heard. No friction rub. No gallop.   Pulmonary:     Effort: Pulmonary effort is normal. No respiratory distress.     Breath sounds: Normal breath sounds. No stridor. No wheezing, rhonchi or rales.  Skin:    General: Skin is warm and dry.  Neurological:     Mental Status: She is alert and oriented to person, place, and time.  Psychiatric:        Behavior: Behavior normal.     There were no vitals taken for this visit. Wt Readings from Last 3 Encounters:  01/20/21 184 lb (83.5 kg)  01/11/21 184 lb (83.5 kg)  11/30/20 186 lb (84.4 kg)    Diabetic Foot Exam - Simple   No data filed    Lab Results  Component Value Date   WBC 7.9 01/11/2021   HGB 13.0 01/11/2021   HCT 38.0 01/11/2021   PLT 186.0 01/11/2021   GLUCOSE 93 01/11/2021   CHOL 137 12/17/2019   TRIG 81.0 12/17/2019   HDL 37.10 (L) 12/17/2019   LDLCALC 83 12/17/2019   ALT 17 01/11/2021   AST 15 01/11/2021   NA 137 01/11/2021   K 4.1 01/11/2021   CL 105 01/11/2021   CREATININE 0.63 01/11/2021   BUN 13 01/11/2021   CO2 24 01/11/2021   TSH 0.98 08/25/2020   INR 1.05 11/25/2015    Lab Results  Component Value Date   TSH 0.98 08/25/2020   Lab Results  Component Value Date   WBC 7.9 01/11/2021   HGB 13.0 01/11/2021   HCT 38.0 01/11/2021   MCV 85.8 01/11/2021   PLT 186.0 01/11/2021   Lab Results  Component Value Date   NA 137 01/11/2021   K 4.1 01/11/2021   CO2 24 01/11/2021   GLUCOSE 93 01/11/2021   BUN 13 01/11/2021   CREATININE 0.63 01/11/2021   BILITOT 0.4 01/11/2021   ALKPHOS 71 01/11/2021   AST 15 01/11/2021   ALT 17 01/11/2021   PROT 7.2 01/11/2021   ALBUMIN 4.5 01/11/2021   CALCIUM 9.1 01/11/2021   ANIONGAP 7 09/28/2020   GFR 122.43 01/11/2021   Lab Results  Component Value Date   CHOL 137 12/17/2019   Lab Results  Component Value Date   HDL 37.10 (L) 12/17/2019   Lab Results   Component Value Date   LDLCALC 83 12/17/2019   Lab Results  Component Value Date   TRIG 81.0 12/17/2019   Lab Results  Component Value Date   CHOLHDL 4 12/17/2019   No results found for: HGBA1C     Assessment & Plan:   Problem List  Items Addressed This Visit   None      No orders of the defined types were placed in this encounter.   I, Sandford Craze NP, personally preformed the services described in this documentation.  All medical record entries made by the scribe were at my direction and in my presence.  I have reviewed the chart and discharge instructions (if applicable) and agree that the record reflects my personal performance and is accurate and complete. 02/22/2021   I,Shehryar Baig,acting as a Neurosurgeon for Lemont Fillers, NP.,have documented all relevant documentation on the behalf of Lemont Fillers, NP,as directed by  Lemont Fillers, NP while in the presence of Lemont Fillers, NP.   Shehryar [employer]

## 2021-02-22 NOTE — Patient Instructions (Signed)
Please begin patch this evening.

## 2021-02-22 NOTE — Assessment & Plan Note (Signed)
Patient prefers patch. Check urine hcg today.  Initiate ortho evra patch. Discussed importance of ongoing use of condoms to protect against HIV.

## 2021-02-22 NOTE — Assessment & Plan Note (Signed)
Reports no breakouts. Continue valtex 1000mg  once daily for suppressive therapy.

## 2021-03-10 ENCOUNTER — Other Ambulatory Visit: Payer: Self-pay

## 2021-03-10 ENCOUNTER — Encounter: Payer: Self-pay | Admitting: Family

## 2021-03-10 MED ORDER — NORELGESTROMIN-ETH ESTRADIOL 150-35 MCG/24HR TD PTWK: MEDICATED_PATCH | TRANSDERMAL | 3 refills | Status: DC

## 2021-03-10 MED ORDER — SERTRALINE HCL 25 MG PO TABS: 25.0000 mg | ORAL_TABLET | Freq: Every day | ORAL | 1 refills | Status: DC

## 2021-03-10 MED ORDER — SERTRALINE HCL 50 MG PO TABS: 50.0000 mg | ORAL_TABLET | Freq: Every day | ORAL | 0 refills | Status: DC

## 2021-03-10 NOTE — Telephone Encounter (Signed)
According to pharmacy is too soon to filled medication. Please read patient msg

## 2021-03-10 NOTE — Telephone Encounter (Signed)
I would recommend that she start zoloft 50mg  one tab once daily. rx sent.

## 2021-06-23 ENCOUNTER — Other Ambulatory Visit: Payer: Self-pay | Admitting: Family

## 2021-06-23 MED ORDER — SERTRALINE HCL 50 MG PO TABS: 50.0000 mg | ORAL_TABLET | Freq: Every day | ORAL | 0 refills | Status: DC

## 2021-07-13 ENCOUNTER — Ambulatory Visit: Payer: 59 | Admitting: Family

## 2021-07-19 ENCOUNTER — Ambulatory Visit: Payer: 59 | Admitting: Family

## 2021-07-21 ENCOUNTER — Ambulatory Visit: Payer: 59 | Admitting: Family

## 2021-08-25 ENCOUNTER — Ambulatory Visit: Payer: 59 | Admitting: Family

## 2021-09-06 ENCOUNTER — Encounter: Payer: Self-pay | Admitting: Family

## 2021-09-07 MED ORDER — VALACYCLOVIR HCL 1 G PO TABS
1000.0000 mg | ORAL_TABLET | Freq: Every day | ORAL | 0 refills | Status: DC
Start: 2021-09-07 — End: 2021-11-23

## 2021-10-04 ENCOUNTER — Other Ambulatory Visit: Payer: Self-pay | Admitting: Family

## 2021-10-06 ENCOUNTER — Other Ambulatory Visit (HOSPITAL_BASED_OUTPATIENT_CLINIC_OR_DEPARTMENT_OTHER): Payer: Self-pay

## 2021-10-06 ENCOUNTER — Ambulatory Visit: Payer: 59 | Attending: Internal Medicine

## 2021-10-06 DIAGNOSIS — Z23 Encounter for immunization: Secondary | ICD-10-CM

## 2021-10-06 MED ORDER — FLUARIX QUADRIVALENT 0.5 ML IM SUSY
PREFILLED_SYRINGE | INTRAMUSCULAR | 0 refills | Status: DC
  Filled 2021-10-06: qty 0.5, 1d supply, fill #0

## 2021-10-06 NOTE — Progress Notes (Signed)
° °  Covid-19 Vaccination Clinic  Name:  Rebekah Carter    MRN: [id number] DOB: May 28, 1996  10/06/2021  [name] was observed post Covid-19 immunization for 15 minutes without incident. She was provided with Vaccine Information Sheet and instruction to access the V-Safe system.   [name] was instructed to call 911 with any severe reactions post vaccine: Difficulty breathing  Swelling of face and throat  A fast heartbeat  A bad rash all over body  Dizziness and weakness   Immunizations Administered     Name Date Dose VIS Date Route   Pfizer Covid-19 Vaccine Bivalent Booster 10/06/2021  1:19 PM 0.3 mL 06/09/2021 Intramuscular   Manufacturer: [employer], [employer]   Lot: [id number]   NDC: [phone]

## 2021-10-07 ENCOUNTER — Encounter: Payer: Self-pay | Admitting: Family

## 2021-10-08 ENCOUNTER — Other Ambulatory Visit (HOSPITAL_BASED_OUTPATIENT_CLINIC_OR_DEPARTMENT_OTHER): Payer: Self-pay

## 2021-10-08 MED ORDER — PFIZER COVID-19 VAC BIVALENT 30 MCG/0.3ML IM SUSP
INTRAMUSCULAR | 0 refills | Status: DC
  Filled 2021-10-08: qty 0.3, 1d supply, fill #0

## 2021-11-09 ENCOUNTER — Encounter: Payer: Self-pay | Admitting: Family

## 2021-11-15 ENCOUNTER — Encounter: Payer: Self-pay | Admitting: General Practice

## 2021-11-15 ENCOUNTER — Ambulatory Visit: Payer: 59 | Admitting: Family

## 2021-11-23 ENCOUNTER — Ambulatory Visit (INDEPENDENT_AMBULATORY_CARE_PROVIDER_SITE_OTHER): Payer: 59 | Admitting: Family

## 2021-11-23 VITALS — BP 139/63 | HR 77 | Temp 97.7°F | Resp 16 | Ht 66.0 in | Wt 197.4 lb

## 2021-11-23 DIAGNOSIS — F4010 Social phobia, unspecified: Secondary | ICD-10-CM | POA: Diagnosis not present

## 2021-11-23 DIAGNOSIS — A6004 Herpesviral vulvovaginitis: Secondary | ICD-10-CM

## 2021-11-23 DIAGNOSIS — Z30016 Encounter for initial prescription of transdermal patch hormonal contraceptive device: Secondary | ICD-10-CM

## 2021-11-23 DIAGNOSIS — R5383 Other fatigue: Secondary | ICD-10-CM | POA: Diagnosis not present

## 2021-11-23 LAB — CBC WITH DIFFERENTIAL/PLATELET
Basophils Absolute: 0 10*3/uL (ref 0.0–0.1)
Basophils Relative: 0.5 % (ref 0.0–3.0)
Eosinophils Absolute: 0.5 10*3/uL (ref 0.0–0.7)
Eosinophils Relative: 5.3 % — ABNORMAL HIGH (ref 0.0–5.0)
HCT: 36.1 % (ref 36.0–46.0)
Hemoglobin: 11.7 g/dL — ABNORMAL LOW (ref 12.0–15.0)
Lymphocytes Relative: 35.9 % (ref 12.0–46.0)
Lymphs Abs: 3.4 10*3/uL (ref 0.7–4.0)
MCHC: 32.3 g/dL (ref 30.0–36.0)
MCV: 86 fl (ref 78.0–100.0)
Monocytes Absolute: 0.7 10*3/uL (ref 0.1–1.0)
Monocytes Relative: 7.6 % (ref 3.0–12.0)
Neutro Abs: 4.8 10*3/uL (ref 1.4–7.7)
Neutrophils Relative %: 50.7 % (ref 43.0–77.0)
Platelets: 244 10*3/uL (ref 150.0–400.0)
RBC: 4.2 Mil/uL (ref 3.87–5.11)
RDW: 14.6 % (ref 11.5–15.5)
WBC: 9.5 10*3/uL (ref 4.0–10.5)

## 2021-11-23 LAB — FERRITIN: Ferritin: 7.2 ng/mL — ABNORMAL LOW (ref 10.0–291.0)

## 2021-11-23 LAB — TSH: TSH: 2.7 u[IU]/mL (ref 0.35–5.50)

## 2021-11-23 MED ORDER — WOMENS MULTI VITAMIN & MINERAL PO TABS: 1.0000 | ORAL_TABLET | Freq: Every day | ORAL | Status: DC

## 2021-11-23 MED ORDER — SERTRALINE HCL 50 MG PO TABS: ORAL_TABLET | ORAL | 1 refills | Status: DC

## 2021-11-23 MED ORDER — VALACYCLOVIR HCL 1 G PO TABS: 1000.0000 mg | ORAL_TABLET | Freq: Every day | ORAL | 1 refills | Status: DC

## 2021-11-23 NOTE — Patient Instructions (Signed)
Please complete lab work prior to leaving.   

## 2021-11-23 NOTE — Assessment & Plan Note (Signed)
I suspect her iron levels are low.  Check cbc, iron, TSH.

## 2021-11-23 NOTE — Progress Notes (Signed)
Subjective:     Patient ID: Rebekah Carter, female    DOB: 1996-01-29, 26 y.o.   MRN: [id number]  Chief Complaint  Patient presents with   iron deficiency    Here for follow up     HPI Patient is in today for follow up.  Fatigue-  x 1 month. First day of period. Reports that in the past Rebekah Carter has done IV iron infusions but Rebekah Carter got busy and stopped going.  Lab Results  Component Value Date   WBC 7.9 01/11/2021   HGB 13.0 01/11/2021   HCT 38.0 01/11/2021   MCV 85.8 01/11/2021   PLT 186.0 01/11/2021   HSV2- maintained on valtrex suppressive therapy 1000mg  PO Daily. Denies any breakouts in the last year.   Social Anxiety Disorder-maintained on zoloft 50mg  once daily. Reports anxiety has be so/so, notes that Rebekah Carter has been slacking on taking her zoloft.   Contraceptive management- stopped the on Ortho Evra Patch.    There are no preventive care reminders to display for this patient.  Past Medical History:  Diagnosis Date   Anemia    Anxiety    Asthma    as a child    Refusal of blood transfusions as patient is Jehovah's Witness 12/17/2019   Seizures (HCC)    last seizure 8 years ago     Past Surgical History:  Procedure Laterality Date   LUMBAR LAMINECTOMY/DECOMPRESSION MICRODISCECTOMY Right 12/02/2015   Procedure: CENTRAL DECOMPRESSION L5 - S1 AND L4 - L5 FOR SPINAL STENOSIS, MICRODISCECTOMY L4-L5, L5-S1, FORAMINOTOMIES L5 ADN S1 ROOT ON THE RIGHT;  Surgeon: Ranee Gosselin, MD;  Location: [facility];  Service: Orthopedics;  Laterality: Right;   TONSILLECTOMY AND ADENOIDECTOMY  2004    Family History  Problem Relation Age of Onset   Diabetes Maternal Grandfather    Hypertension Maternal Grandfather    Diabetes Paternal Grandfather    Hypertension Paternal Grandfather     Social History   Socioeconomic History   Marital status: Single    Spouse name: Not on file   Number of children: Not on file   Years of education: Not on file   Highest education level: High  school graduate  Occupational History   Not on file  Tobacco Use   Smoking status: Never   Smokeless tobacco: Never  Vaping Use   Vaping Use: Never used  Substance and Sexual Activity   Alcohol use: No   Drug use: No   Sexual activity: Never    Birth control/protection: None  Other Topics Concern   Not on file  Social History Narrative   Works 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      Social Determinants of Health   Financial Resource Strain: Not on file  Food Insecurity: Not on file  Transportation Needs: Not on file  Physical Activity: Not on file  Stress: Not on file  Social Connections: Not on file  Intimate Partner Violence: Not on file    Outpatient Medications Prior to Visit  Medication Sig Dispense Refill   COVID-19 mRNA bivalent vaccine, Pfizer, (PFIZER COVID-19 VAC BIVALENT) injection Inject into the muscle. 0.3 mL 0   influenza vac split quadrivalent PF (FLUARIX QUADRIVALENT) 0.5 ML injection Inject into the muscle. 0.5 mL 0   norelgestromin-ethinyl estradiol (ORTHO EVRA) 150-35 MCG/24HR transdermal patch Apply one patch weekly for 3 weeks, then 1 week  patch free 12 patch 3   sertraline (ZOLOFT) 50 MG tablet TAKE 1 TABLET(50 MG) BY MOUTH DAILY 90 tablet 0   valACYclovir (VALTREX) 1000 MG tablet Take 1 tablet (1,000 mg total) by mouth daily. 90 tablet 0   No facility-administered medications prior to visit.    Allergies  Allergen Reactions   Amoxicillin Rash and Other (See Comments)    REACTION: seizures   Latex Rash   Other Other (See Comments)    Patient is a Jehovah's Witness    Peanut-Containing Drug Products Itching, Swelling and Rash    ROS    See HPI  Objective:    Physical Exam Constitutional:      General: Rebekah Carter is not in acute distress.    Appearance: Normal appearance. Rebekah Carter is well-developed.  HENT:     Head: Normocephalic and atraumatic.      Right Ear: External ear normal.     Left Ear: External ear normal.  Eyes:     General: No scleral icterus. Neck:     Thyroid: No thyromegaly.  Cardiovascular:     Rate and Rhythm: Normal rate and regular rhythm.     Heart sounds: Normal heart sounds. No murmur heard. Pulmonary:     Effort: Pulmonary effort is normal. No respiratory distress.     Breath sounds: Normal breath sounds. No wheezing.  Musculoskeletal:     Cervical back: Neck supple.  Skin:    General: Skin is warm and dry.  Neurological:     Mental Status: Rebekah Carter is alert and oriented to person, place, and time.  Psychiatric:        Mood and Affect: Mood normal.        Behavior: Behavior normal.        Thought Content: Thought content normal.        Judgment: Judgment normal.    BP 139/63 (BP Location: Right Arm, Patient Position: Sitting, Cuff Size: Normal)    Pulse 77    Temp 97.7 F (36.5 C) (Oral)    Resp 16    Ht 5\' 6"  (1.676 m)    Wt 197 lb 6.4 oz (89.5 kg)    SpO2 92%    BMI 31.86 kg/m  Wt Readings from Last 3 Encounters:  11/23/21 197 lb 6.4 oz (89.5 kg)  02/22/21 189 lb (85.7 kg)  01/20/21 184 lb (83.5 kg)       Assessment & Plan:   Problem List Items Addressed This Visit       Unprioritized   Social anxiety disorder    Rebekah Carter is encouraged to be regular with her zoloft as Rebekah Carter has had some uneven moods with her erratic dosing.        Relevant Medications   sertraline (ZOLOFT) 50 MG tablet   Herpes, genital    Stable on suppressive therapy. Continue valtrex 1000mg  once daily.      Relevant Medications   valACYclovir (VALTREX) 1000 MG tablet   Fatigue - Primary    I suspect her iron levels are low.  Check cbc, iron, TSH.       Relevant Orders   CBC with Differential/Platelet   Iron   Ferritin   TSH   Contraceptive management    Rebekah Carter does not wish to be on birth control. States Rebekah Carter is OK with the idea of becoming pregnant "if it happens." Rebekah Carter is advised to continue her multivitamin.         I have discontinued Sulema Youman's norelgestromin-ethinyl estradiol, Fluarix Quadrivalent,  and Press photographer. I am also having her start on Womens Multi Vitamin & Mineral. Additionally, I am having her maintain her sertraline and valACYclovir.  Meds ordered this encounter  Medications   sertraline (ZOLOFT) 50 MG tablet    Sig: TAKE 1 TABLET(50 MG) BY MOUTH DAILY    Dispense:  90 tablet    Refill:  1    Needs office visit before any further refills    Order Specific Question:   Supervising Provider    Answer:   Danise Edge A [[id number]]   valACYclovir (VALTREX) 1000 MG tablet    Sig: Take 1 tablet (1,000 mg total) by mouth daily.    Dispense:  90 tablet    Refill:  1    Order Specific Question:   Supervising Provider    Answer:   Danise Edge A [[id number]]   Multiple Vitamins-Minerals (WOMENS MULTI VITAMIN & MINERAL) TABS    Sig: Take 1 tablet by mouth daily.    Order Specific Question:   Supervising Provider    Answer:   Danise Edge A [[id number]]

## 2021-11-23 NOTE — Assessment & Plan Note (Signed)
She is encouraged to be regular with her zoloft as she has had some uneven moods with her erratic dosing.

## 2021-11-23 NOTE — Assessment & Plan Note (Signed)
She does not wish to be on birth control. States she is OK with the idea of becoming pregnant "if it happens." She is advised to continue her multivitamin.

## 2021-11-23 NOTE — Assessment & Plan Note (Signed)
Stable on suppressive therapy. Continue valtrex 1000mg  once daily.

## 2021-11-24 ENCOUNTER — Ambulatory Visit: Payer: 59 | Admitting: Family

## 2021-11-24 LAB — IRON: Iron: 49 ug/dL (ref 42–145)

## 2021-11-25 ENCOUNTER — Telehealth: Payer: Self-pay | Admitting: Family

## 2021-11-25 DIAGNOSIS — D509 Iron deficiency anemia, unspecified: Secondary | ICD-10-CM

## 2021-11-25 NOTE — Telephone Encounter (Signed)
Iron is low. I am going to get her set back up with hematology.

## 2021-11-26 NOTE — Telephone Encounter (Signed)
Left voicemail for patient to call back about results.

## 2021-11-30 ENCOUNTER — Inpatient Hospital Stay: Payer: 59 | Admitting: Family

## 2021-11-30 ENCOUNTER — Inpatient Hospital Stay: Payer: 59

## 2021-12-06 ENCOUNTER — Inpatient Hospital Stay: Payer: 59

## 2021-12-06 ENCOUNTER — Other Ambulatory Visit: Payer: Self-pay

## 2021-12-06 ENCOUNTER — Encounter: Payer: Self-pay | Admitting: Family

## 2021-12-06 ENCOUNTER — Inpatient Hospital Stay: Payer: 59 | Attending: Family | Admitting: Family

## 2021-12-06 VITALS — BP 120/58 | HR 65 | Temp 98.2°F | Resp 17 | Wt 197.1 lb

## 2021-12-06 VITALS — BP 122/75 | HR 58 | Resp 17

## 2021-12-06 DIAGNOSIS — Z79899 Other long term (current) drug therapy: Secondary | ICD-10-CM | POA: Diagnosis not present

## 2021-12-06 DIAGNOSIS — D500 Iron deficiency anemia secondary to blood loss (chronic): Secondary | ICD-10-CM

## 2021-12-06 DIAGNOSIS — D509 Iron deficiency anemia, unspecified: Secondary | ICD-10-CM | POA: Diagnosis present

## 2021-12-06 MED ORDER — SODIUM CHLORIDE 0.9 % IV SOLN
300.0000 mg | Freq: Once | INTRAVENOUS | Status: AC
  Administered 2021-12-06: 300 mg via INTRAVENOUS
  Filled 2021-12-06: qty 300

## 2021-12-06 MED ORDER — SODIUM CHLORIDE 0.9 % IV SOLN: Freq: Once | INTRAVENOUS | Status: AC

## 2021-12-06 NOTE — Patient Instructions (Signed)

## 2021-12-06 NOTE — Progress Notes (Signed)
Pt declined to stay for post infusion observation period. Pt stated she has tolerated medication multiple times prior without difficulty. Pt aware to call clinic with any questions or concerns. Pt verbalized understanding and had no further questions.  ? ?

## 2021-12-06 NOTE — Progress Notes (Signed)
Hematology and Oncology Follow Up Visit  Rebekah Carter [id number] Nov 09, 1995 26 y.o. 12/06/2021   Principle Diagnosis:  Iron deficiency anemia    Of Note: Patient is Jehovah's Witness, no blood products   Current Therapy:   IV iron as indicated    Interim History:  Rebekah Carter is here today to re-establish care for IDA. She is symptomatic with fatigue and weakness.  2 weeks ago her ferritin was 7 and total iron 49.  Her cycle is regular with the first day being heavy. No clots noted.  No other blood loss noted. No bruising or petechiae.  No fever, chills, n/v, cough, rash, dizziness, SOB, chest pain, palpitations, abdominal pain or changes in bowel or bladder habits at this time.  No swelling, tenderness, numbness or tingling in her extremities.  No falls or syncope.  She has maintained a good appetite and is staying well hydrated. She does not eat red meat and only eats limited amounts of chicken and fish. Her weight is stable at 197 lbs.   ECOG Performance Status: 1 - Symptomatic but completely ambulatory  Medications:  Allergies as of 12/06/2021       Reactions   Amoxicillin Rash, Other (See Comments)   REACTION: seizures   Latex Rash   Other Other (See Comments)   Patient is a Jehovah's Witness    Peanut-containing Drug Products Itching, Swelling, Rash        Medication List        Accurate as of December 06, 2021  2:30 PM. If you have any questions, ask your nurse or doctor.          sertraline 50 MG tablet Commonly known as: ZOLOFT TAKE 1 TABLET(50 MG) BY MOUTH DAILY   valACYclovir 1000 MG tablet Commonly known as: Valtrex Take 1 tablet (1,000 mg total) by mouth daily.   Womens Multi Vitamin & Mineral Tabs Take 1 tablet by mouth daily.        Allergies:  Allergies  Allergen Reactions   Amoxicillin Rash and Other (See Comments)    REACTION: seizures   Latex Rash   Other Other (See Comments)    Patient is a Jehovah's Witness     Peanut-Containing Drug Products Itching, Swelling and Rash    Past Medical History, Surgical history, Social history, and Family History were reviewed and updated.  Review of Systems: All other 10 point review of systems is negative.   Physical Exam:  weight is 197 lb 1.3 oz (89.4 kg). Her oral temperature is 98.2 F (36.8 C). Her blood pressure is 120/58 (abnormal) and her pulse is 65. Her respiration is 17 and oxygen saturation is 98%.   Wt Readings from Last 3 Encounters:  12/06/21 197 lb 1.3 oz (89.4 kg)  11/23/21 197 lb 6.4 oz (89.5 kg)  02/22/21 189 lb (85.7 kg)    Ocular: Sclerae unicteric, pupils equal, round and reactive to light Ear-nose-throat: Oropharynx clear, dentition fair Lymphatic: No cervical or supraclavicular adenopathy Lungs no rales or rhonchi, good excursion bilaterally Heart regular rate and rhythm, no murmur appreciated Abd soft, nontender, positive bowel sounds MSK no focal spinal tenderness, no joint edema Neuro: non-focal, well-oriented, appropriate affect Breasts: Deferred   Lab Results  Component Value Date   WBC 9.5 11/23/2021   HGB 11.7 (L) 11/23/2021   HCT 36.1 11/23/2021   MCV 86.0 11/23/2021   PLT 244.0 11/23/2021   Lab Results  Component Value Date   FERRITIN 7.2 (L) 11/23/2021   IRON 49 11/23/2021  TIBC 558 (H) 09/28/2020   UIBC 537 (H) 09/28/2020   IRONPCTSAT 4 (L) 09/28/2020   Lab Results  Component Value Date   RETICCTPCT 1.3 09/28/2020   RBC 4.20 11/23/2021   No results found for: KPAFRELGTCHN, LAMBDASER, KAPLAMBRATIO No results found for: Loel Lofty, IGMSERUM No results found for: Marda Stalker, SPEI   Chemistry      Component Value Date/Time   NA 137 01/11/2021 0942   K 4.1 01/11/2021 0942   CL 105 01/11/2021 0942   CO2 24 01/11/2021 0942   BUN 13 01/11/2021 0942   CREATININE 0.63 01/11/2021 0942   CREATININE 0.76 09/28/2020 1502   CREATININE 0.61  12/11/2013 1107      Component Value Date/Time   CALCIUM 9.1 01/11/2021 0942   ALKPHOS 71 01/11/2021 0942   AST 15 01/11/2021 0942   AST 16 09/28/2020 1502   ALT 17 01/11/2021 0942   ALT 13 09/28/2020 1502   BILITOT 0.4 01/11/2021 0942   BILITOT 0.3 09/28/2020 1502       Impression and Plan: Rebekah Carter is a very pleasant 26 yo Hispanic female with history of iron deficiency anemia.  IV iron given today. We will get her scheduled for another 2 doses after today. Follow-up in 4 months.   Eileen Stanford, NP 2/27/20232:30 PM

## 2021-12-10 ENCOUNTER — Telehealth: Payer: Self-pay | Admitting: *Deleted

## 2021-12-10 NOTE — Telephone Encounter (Signed)
Per 12/06/21 los - called and lvm of upcoming appointments - requested callback to confirm °

## 2021-12-10 NOTE — Telephone Encounter (Signed)
Called and lvm for call back to schedule (2) doses of IV Iron ?

## 2021-12-22 ENCOUNTER — Ambulatory Visit (INDEPENDENT_AMBULATORY_CARE_PROVIDER_SITE_OTHER): Payer: 59 | Admitting: Family

## 2021-12-22 ENCOUNTER — Telehealth: Payer: Self-pay

## 2021-12-22 VITALS — BP 129/74 | HR 97 | Temp 98.3°F | Resp 16 | Ht 64.0 in | Wt 195.0 lb

## 2021-12-22 DIAGNOSIS — J45901 Unspecified asthma with (acute) exacerbation: Secondary | ICD-10-CM | POA: Diagnosis not present

## 2021-12-22 MED ORDER — PREDNISONE 10 MG PO TABS: ORAL_TABLET | ORAL | 0 refills | Status: DC

## 2021-12-22 MED ORDER — ALBUTEROL SULFATE HFA 108 (90 BASE) MCG/ACT IN AERS: 2.0000 | INHALATION_SPRAY | Freq: Four times a day (QID) | RESPIRATORY_TRACT | 0 refills | Status: DC | PRN

## 2021-12-22 NOTE — Telephone Encounter (Signed)
Nurse Assessment ?Nurse: Henri Medal, RN, Amy Date/Time Lamount Cohen Time): 12/22/2021 8:01:28 AM ?Confirm and document reason for call. If ?symptomatic, describe symptoms. ?---Caller states she tested negative for Covid. She is ?wheezing and having difficulty breathing. She started ?getting symptoms Monday. Tuesday she was sneezing ?a lot from her allergies. Today her chest feels really ?tight. She is talking in sentences & says she is only ?SOB when she lays down or exerting herself. O2 sat ?is 97%. She uses Albuterol prn, but does not have an ?inhaler right now. ?Does the patient have any new or worsening ?symptoms? ---Yes ?Will a triage be completed? ---Yes ?Related visit to physician within the last 2 weeks? ---No ?Does the PT have any chronic conditions? (i.e. ?diabetes, asthma, this includes High risk factors for ?pregnancy, etc.) ?---Yes ?List chronic conditions. ---asthma, anemia ?Is the patient pregnant or possibly pregnant? (Ask ?all females between the ages of 26-55) ---No ?Is this a behavioral health or substance abuse call? ---No ?Guidelines ?Guideline Title Affirmed Question Affirmed Notes Nurse Date/Time (Eastern ?Time) ?Asthma Attack [1] MILD asthma ?attack (e.g., no ?SOB at rest, mild ?Lovelace, RN, Amy 12/22/2021 8:02:29 ?AM ?PLEASE NOTE: All timestamps contained within this report are represented as [place] Standard Time. ?CONFIDENTIALTY NOTICE: This fax transmission is intended only for the addressee. It contains information that is legally privileged, confidential or ?otherwise protected from use or disclosure. If you are not the intended recipient, you are strictly prohibited from reviewing, disclosing, copying using ?or disseminating any of this information or taking any action in reliance on or regarding this information. If you have received this fax in error, please ?notify us immediately by telephone so that we can arrange for its return to [place]. Phone: [phone], Toll-Free: [phone], Fax:  [phone] ?Page: 2 of 2 ?Call Id: [id number] ?Guidelines ?Guideline Title Affirmed Question Affirmed Notes Nurse Date/Time (Eastern ?Time) ?SOB with walking, ?speaks normally ?in sentences, mild ?wheezing) AND [2] ?lasting > 24 hours on ?prescribed treatment ?Disp. Time (Eastern ?Time) Disposition Final User ?12/22/2021 7:59:56 AM Send to Urgent Queue Bjorn Loser ?12/22/2021 8:10:29 AM See PCP within 24 Hours Yes Lovelace, RN, Amy ?Caller Disagree/Comply Comply ?Caller Understands Yes ?PreDisposition InappropriateToAsk ?Care Advice Given Per Guideline ?SEE PCP WITHIN 24 HOURS: * IF OFFICE WILL BE OPEN: You need to be examined within the next 24 hours. Call your doctor ?(or NP/PA) when the office opens and make an appointment. ASTHMA ATTACK - TREATMENT - QUICK-RELIEF MEDICINE: ?* Start your quick-relief medicine (e.g., albuterol, salbutamol) at the first sign of any coughing or shortness of breath (don't wait for ?wheezing). * For MILD ASTHMA SYMPTOMS use your quick-relief inhaler (2 puffs each time) or your nebulizer every 4 hours. ?Continue the quick-relief asthma medicine until you have not wheezed or coughed for 48 hours. It takes a minimum of 7 days of ?medicine for lung function to return to normal. DRINK PLENTY OF LIQUIDS AND USE A HUMIDIFIER: * Drink plenty of ?liquids. It is important to stay well-hydrated. HAY FEVER AND ANTIHISTAMINE MEDICINES: * If you have symptoms from ?hay fever, it's OK to take antihistamines. * Reason: Poor control of allergic rhinitis makes asthma worse, but antihistamines don't ?make asthma worse. CALL BACK IF: * You become worse CARE ADVICE given per Asthma Attack (Adult) guideline. ?Comments ?User: Rebekah Horsfall, RN Date/Time Lamount Cohen Time): 12/22/2021 8:12:23 AM ?Warm transferred caller to Katie at the office to make an appt. ?Referrals ?REFERRED TO PCP OFFICE ? ?Appt today w/ Melissa ?

## 2021-12-22 NOTE — Progress Notes (Signed)
? ?Subjective:  ? ?By signing my name below, I, Shehryar Baig, attest that this documentation has been prepared under the direction and in the presence of Debbrah Alar, NP. 12/22/2021 ?  ? ? Patient ID: Rebekah Carter, female    DOB: [birth year], 26 y.o.   MRN: [id number] ? ?Chief Complaint  ?Patient presents with  ? Wheezing  ?  Complains of wheezing and chest feeling thigh   ? ? ?Wheezing  ?Pertinent negatives include no fever.  ?Patient is in today for a office visit.  ?Wheezing- She complains of wheezing since Tuesday, 12/21/2021. She started having congestion on Monday which developed into chest congestion on Tuesday. Her symptoms worsened today. She does not have an inhaler at home. She typically gets allergies around this time of the year but it does not usually affect her lungs. She has a history of asthma. She is taking zyrtec and sudafed to manage her symptoms and finds mild relief. She denies having any fevers.  ? ? ?There are no preventive care reminders to display for this patient. ? ?Past Medical History:  ?Diagnosis Date  ? Anemia   ? Anxiety   ? Asthma   ? as a child   ? Refusal of blood transfusions as patient is Jehovah's Witness 12/17/2019  ? Seizures ([place])   ? last seizure 8 years ago   ? ? ?Past Surgical History:  ?Procedure Laterality Date  ? LUMBAR LAMINECTOMY/DECOMPRESSION MICRODISCECTOMY Right 12/02/2015  ? Procedure: CENTRAL DECOMPRESSION L5 - S1 AND L4 - L5 FOR SPINAL STENOSIS, MICRODISCECTOMY L4-L5, L5-S1, FORAMINOTOMIES L5 ADN S1 ROOT ON THE RIGHT;  Surgeon: Latanya Maudlin, MD;  Location: [facility];  Service: Orthopedics;  Laterality: Right;  ? TONSILLECTOMY AND ADENOIDECTOMY  2004  ? ? ?Family History  ?Problem Relation Age of Onset  ? Diabetes Maternal Grandfather   ? Hypertension Maternal Grandfather   ? Diabetes Paternal Grandfather   ? Hypertension Paternal Grandfather   ? ? ?Social History  ? ?Socioeconomic History  ? Marital status: Single  ?  Spouse name: Not on file  ? Number  of children: Not on file  ? Years of education: Not on file  ? Highest education level: High school graduate  ?Occupational History  ? Not on file  ?Tobacco Use  ? Smoking status: Never  ? Smokeless tobacco: Never  ?Vaping Use  ? Vaping Use: Never used  ?Substance and Sexual Activity  ? Alcohol use: No  ? Drug use: No  ? Sexual activity: Never  ?  Birth control/protection: None  ?Other Topics Concern  ? Not on file  ?Social History Narrative  ? 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 Lives with her parents  ? Only child  ? Enjoys shopping, sleep.  ?   ? ?Social Determinants of Health  ? ?Financial Resource Strain: Not on file  ?Food Insecurity: Not on file  ?Transportation Needs: Not on file  ?Physical Activity: Not on file  ?Stress: Not on file  ?Social Connections: Not on file  ?Intimate Partner Violence: Not on file  ? ? ?Outpatient Medications Prior to Visit  ?Medication Sig Dispense Refill  ? Multiple Vitamins-Minerals (WOMENS MULTI VITAMIN & MINERAL) TABS Take 1 tablet by mouth daily.    ? sertraline (ZOLOFT) 50 MG tablet TAKE 1 TABLET(50 MG) BY MOUTH DAILY 90 tablet 1  ? valACYclovir (VALTREX) 1000 MG tablet Take 1 tablet (1,000 mg total) by mouth daily. 90 tablet 1  ? ?  No facility-administered medications prior to visit.  ? ? ?Allergies  ?Allergen Reactions  ? Amoxicillin Rash and Other (See Comments)  ?  REACTION: seizures  ? Latex Rash  ? Other Other (See Comments)  ?  Patient is a Sales promotion account executive Witness   ? Peanut-Containing Drug Products Itching, Swelling and Rash  ? ? ?Review of Systems  ?Constitutional:  Negative for fever.  ?HENT:  Positive for congestion.   ?Respiratory:  Positive for wheezing.   ?     (+)chest congestion  ? ?   ?Objective:  ?  ?Physical Exam ?Constitutional:   ?   General: She is not in acute distress. ?   Appearance: Normal appearance. She is not ill-appearing.  ?HENT:  ?   Head: Normocephalic and atraumatic.  ?   Right Ear: External ear normal.  ?   Left  Ear: External ear normal.  ?Eyes:  ?   Extraocular Movements: Extraocular movements intact.  ?   Pupils: Pupils are equal, round, and reactive to light.  ?Cardiovascular:  ?   Rate and Rhythm: Normal rate and regular rhythm.  ?   Heart sounds: Normal heart sounds. No murmur heard. ?  No gallop.  ?Pulmonary:  ?   Effort: Pulmonary effort is normal. No respiratory distress.  ?   Breath sounds: Normal breath sounds. No wheezing or rales.  ?Skin: ?   General: Skin is warm and dry.  ?Neurological:  ?   Mental Status: She is alert and oriented to person, place, and time.  ?Psychiatric:     ?   Judgment: Judgment normal.  ? ? ?BP 129/74 (BP Location: Left Arm, Patient Position: Sitting, Cuff Size: Small)   Pulse 97   Temp 98.3 ?F (36.8 ?C) (Oral)   Resp 16   Ht 5\' 4"  (1.626 m)   Wt 195 lb (88.5 kg)   SpO2 (!) 80%   BMI 33.47 kg/m?  ?Wt Readings from Last 3 Encounters:  ?12/22/21 195 lb (88.5 kg)  ?12/06/21 197 lb 1.3 oz (89.4 kg)  ?11/23/21 197 lb 6.4 oz (89.5 kg)  ? ? ?   ?Assessment & Plan:  ? ?Problem List Items Addressed This Visit   ? ?  ? Unprioritized  ? Moderate asthma with acute exacerbation - Primary  ?  New. No overt wheezing on exam, but she feels like her breathing is tight. Will rx with Prednisone taper and prn albuterol. Pt is advised to call if symptoms worsen or if symptoms do not improve.  ?  ?  ? Relevant Medications  ? predniSONE (DELTASONE) 10 MG tablet  ? albuterol (VENTOLIN HFA) 108 (90 Base) MCG/ACT inhaler  ? ? ? ?Meds ordered this encounter  ?Medications  ? predniSONE (DELTASONE) 10 MG tablet  ?  Sig: 4 tabs by mouth once daily for 2 days, then 3 tabs daily x 2 days, then 2 tabs daily x 2 days, then 1 tab daily x 2 days  ?  Dispense:  20 tablet  ?  Refill:  0  ?  Order Specific Question:   Supervising Provider  ?  Answer:   Penni Homans A [id number]  ? albuterol (VENTOLIN HFA) 108 (90 Base) MCG/ACT inhaler  ?  Sig: Inhale 2 puffs into the lungs every 6 (six) hours as needed for wheezing or  shortness of breath.  ?  Dispense:  8 g  ?  Refill:  0  ?  Order Specific Question:   Supervising Provider  ?  Answer:  [place], [place] [id number]  ? ? ?I, Nance Pear, NP, personally preformed the services described in this documentation.  All medical record entries made by the scribe were at my direction and in my presence.  I have reviewed the chart and discharge instructions (if applicable) and agree that the record reflects my personal performance and is accurate and complete. 12/22/2021 ? ? ?Engineering geologist as a Education administrator for [employer], NP.,have documented all relevant documentation on the behalf of Nance Pear, NP,as directed by  Nance Pear, NP while in the presence of Nance Pear, NP. ? ? ?Nance Pear, NP ? ?

## 2021-12-22 NOTE — Assessment & Plan Note (Addendum)
New. No overt wheezing on exam, but she feels like her breathing is tight. Will rx with Prednisone taper and prn albuterol. Pt is advised to call if symptoms worsen or if symptoms do not improve.  ?

## 2021-12-22 NOTE — Patient Instructions (Signed)
Please begin prednisone taper.  ?Begin albuterol every 4-6 hours as needed for wheezing.  ?Call if symptoms worsen or if not improved in 2-3 days.  ?

## 2022-01-18 ENCOUNTER — Encounter: Payer: Self-pay | Admitting: Family

## 2022-01-18 ENCOUNTER — Encounter: Payer: 59 | Admitting: Family

## 2022-04-05 ENCOUNTER — Inpatient Hospital Stay: Payer: 59 | Attending: Hematology & Oncology

## 2022-04-05 ENCOUNTER — Inpatient Hospital Stay: Payer: 59 | Admitting: Family

## 2022-05-05 ENCOUNTER — Encounter: Payer: Self-pay | Admitting: Family

## 2022-05-05 ENCOUNTER — Ambulatory Visit (INDEPENDENT_AMBULATORY_CARE_PROVIDER_SITE_OTHER): Payer: 59 | Admitting: Family

## 2022-05-05 VITALS — BP 117/73 | HR 95 | Temp 98.4°F | Resp 16 | Wt 194.0 lb

## 2022-05-05 DIAGNOSIS — D509 Iron deficiency anemia, unspecified: Secondary | ICD-10-CM

## 2022-05-05 DIAGNOSIS — Z0000 Encounter for general adult medical examination without abnormal findings: Secondary | ICD-10-CM | POA: Diagnosis not present

## 2022-05-05 DIAGNOSIS — Z23 Encounter for immunization: Secondary | ICD-10-CM

## 2022-05-05 DIAGNOSIS — R739 Hyperglycemia, unspecified: Secondary | ICD-10-CM

## 2022-05-05 DIAGNOSIS — F419 Anxiety disorder, unspecified: Secondary | ICD-10-CM | POA: Diagnosis not present

## 2022-05-05 LAB — CBC WITH DIFFERENTIAL/PLATELET
Basophils Absolute: 0 10*3/uL (ref 0.0–0.1)
Basophils Relative: 0.5 % (ref 0.0–3.0)
Eosinophils Absolute: 0.3 10*3/uL (ref 0.0–0.7)
Eosinophils Relative: 3.6 % (ref 0.0–5.0)
HCT: 35.2 % — ABNORMAL LOW (ref 36.0–46.0)
Hemoglobin: 11.7 g/dL — ABNORMAL LOW (ref 12.0–15.0)
Lymphocytes Relative: 36.1 % (ref 12.0–46.0)
Lymphs Abs: 3 10*3/uL (ref 0.7–4.0)
MCHC: 33.3 g/dL (ref 30.0–36.0)
MCV: 84.8 fl (ref 78.0–100.0)
Monocytes Absolute: 0.7 10*3/uL (ref 0.1–1.0)
Monocytes Relative: 8.7 % (ref 3.0–12.0)
Neutro Abs: 4.2 10*3/uL (ref 1.4–7.7)
Neutrophils Relative %: 51.1 % (ref 43.0–77.0)
Platelets: 245 10*3/uL (ref 150.0–400.0)
RBC: 4.15 Mil/uL (ref 3.87–5.11)
RDW: 14.2 % (ref 11.5–15.5)
WBC: 8.2 10*3/uL (ref 4.0–10.5)

## 2022-05-05 LAB — HEMOGLOBIN A1C: Hgb A1c MFr Bld: 5.4 % (ref 4.6–6.5)

## 2022-05-05 LAB — COMPREHENSIVE METABOLIC PANEL
ALT: 17 U/L (ref 0–35)
AST: 16 U/L (ref 0–37)
Albumin: 4.4 g/dL (ref 3.5–5.2)
Alkaline Phosphatase: 75 U/L (ref 39–117)
BUN: 11 mg/dL (ref 6–23)
CO2: 27 mEq/L (ref 19–32)
Calcium: 9.3 mg/dL (ref 8.4–10.5)
Chloride: 104 mEq/L (ref 96–112)
Creatinine, Ser: 0.62 mg/dL (ref 0.40–1.20)
GFR: 121.78 mL/min (ref >60.00)
Glucose, Bld: 91 mg/dL (ref 70–99)
Potassium: 4 mEq/L (ref 3.5–5.1)
Sodium: 137 mEq/L (ref 135–145)
Total Bilirubin: 0.4 mg/dL (ref 0.2–1.2)
Total Protein: 7.4 g/dL (ref 6.0–8.3)

## 2022-05-05 NOTE — Patient Instructions (Signed)
Please schedule a follow up with hematology.

## 2022-05-05 NOTE — Assessment & Plan Note (Addendum)
Discussed healthy diet, exercise, weight loss. Td today. Pap up to date. Recommended covid booster this fall.

## 2022-05-05 NOTE — Progress Notes (Signed)
bc  Subjective:     Patient ID: Rebekah Carter, female    DOB: April 04, 1996, 26 y.o.   MRN: [id number]  Chief Complaint  Patient presents with   Annual Exam         HPI  Patient presents today for complete physical.  Immunizations: Pfizer x 2, Td today  Diet: Struggling, upset with her weight Wt Readings from Last 3 Encounters:  05/05/22 194 lb (88 kg)  12/22/21 195 lb (88.5 kg)  12/06/21 197 lb 1.3 oz (89.4 kg)  Exercise:  works at [employer].  Plans to start to start a regular routine.  Pap Smear: 2022- normal Vision/Dental- up to date  Anxiety- stable on zoloft. Denies depression There are no preventive care reminders to display for this patient.  Past Medical History:  Diagnosis Date   Anemia    Anxiety    Asthma    as a child    Refusal of blood transfusions as patient is Jehovah's Witness 12/17/2019   Seizures ([place])    last seizure 8 years ago     Past Surgical History:  Procedure Laterality Date   LUMBAR LAMINECTOMY/DECOMPRESSION MICRODISCECTOMY Right 12/02/2015   Procedure: CENTRAL DECOMPRESSION L5 - S1 AND L4 - L5 FOR SPINAL STENOSIS, MICRODISCECTOMY L4-L5, L5-S1, FORAMINOTOMIES L5 ADN S1 ROOT ON THE RIGHT;  Surgeon: Latanya Maudlin, MD;  Location: [facility];  Service: Orthopedics;  Laterality: Right;   TONSILLECTOMY AND ADENOIDECTOMY  2004    Family History  Problem Relation Age of Onset   Diabetes Maternal Grandfather    Hypertension Maternal Grandfather    Diabetes Paternal Grandfather    Hypertension Paternal Grandfather     Social History   Socioeconomic History   Marital status: Single    Spouse name: Not on file   Number of children: Not on file   Years of education: Not on file   Highest education level: High school graduate  Occupational History   Not on file  Tobacco Use   Smoking status: Never   Smokeless tobacco: Never  Vaping Use   Vaping Use: Some days  Substance and Sexual Activity   Alcohol use: No   Drug use: No   Sexual  activity: Yes    Partners: Male    Birth control/protection: Condom  Other Topics Concern   Not on file  Social History Narrative   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      Social Determinants of Health   Financial Resource Strain: Not on file  Food Insecurity: Not on file  Transportation Needs: Not on file  Physical Activity: Not on file  Stress: Not on file  Social Connections: Not on file  Intimate Partner Violence: Not on file    Outpatient Medications Prior to Visit  Medication Sig Dispense Refill   albuterol (VENTOLIN HFA) 108 (90 Base) MCG/ACT inhaler Inhale 2 puffs into the lungs every 6 (six) hours as needed for wheezing or shortness of breath. 8 g 0   Multiple Vitamins-Minerals (WOMENS MULTI VITAMIN & MINERAL) TABS Take 1 tablet by mouth daily.     sertraline (ZOLOFT) 50 MG tablet TAKE 1 TABLET(50 MG) BY MOUTH DAILY 90 tablet 1   valACYclovir (VALTREX) 1000 MG tablet Take 1 tablet (1,000 mg total) by mouth daily. 90 tablet 1   predniSONE (DELTASONE) 10 MG tablet 4 tabs by mouth once daily for  2 days, then 3 tabs daily x 2 days, then 2 tabs daily x 2 days, then 1 tab daily x 2 days 20 tablet 0   No facility-administered medications prior to visit.    Allergies  Allergen Reactions   Amoxicillin Rash and Other (See Comments)    REACTION: seizures   Latex Rash   Other Other (See Comments)    Patient is a Jehovah's Witness    Peanut-Containing Drug Products Itching, Swelling and Rash    Review of Systems  Constitutional:  Negative for weight loss.  HENT:  Positive for congestion (due to allergies). Negative for hearing loss.   Eyes:  Negative for blurred vision.  Respiratory:  Negative for cough and shortness of breath.   Cardiovascular:  Negative for chest pain.  Gastrointestinal:  Negative for constipation and diarrhea.  Genitourinary:  Negative for dysuria and  frequency.       Reports regular periods  Musculoskeletal:  Negative for joint pain and myalgias.  Skin:  Negative for rash.  Neurological:  Negative for headaches.       Objective:    Physical Exam  BP 117/73 (BP Location: Right Arm, Patient Position: Sitting, Cuff Size: Small)   Pulse 95   Temp 98.4 F (36.9 C) (Oral)   Resp 16   Wt 194 lb (88 kg)   SpO2 96%   BMI 33.30 kg/m  Wt Readings from Last 3 Encounters:  05/05/22 194 lb (88 kg)  12/22/21 195 lb (88.5 kg)  12/06/21 197 lb 1.3 oz (89.4 kg)   Physical Exam  Constitutional: She is oriented to person, place, and time. She appears well-developed and well-nourished. No distress.  HENT:  Head: Normocephalic and atraumatic.  Right Ear: Tympanic membrane and ear canal normal.  Left Ear: Tympanic membrane and ear canal normal.  Mouth/Throat: Oropharynx is clear and moist.  Eyes: Pupils are equal, round, and reactive to light. No scleral icterus.  Neck: Normal range of motion. No thyromegaly present.  Cardiovascular: Normal rate and regular rhythm.   No murmur heard. Pulmonary/Chest: Effort normal and breath sounds normal. No respiratory distress. He has no wheezes. She has no rales. She exhibits no tenderness.  Abdominal: Soft. Bowel sounds are normal. She exhibits no distension and no mass. There is no tenderness. There is no rebound and no guarding.  Musculoskeletal: She exhibits no edema.  Lymphadenopathy:    She has no cervical adenopathy.  Neurological: She is alert and oriented to person, place, and time. She has normal patellar reflexes. She exhibits normal muscle tone. Coordination normal.  Skin: Skin is warm and dry.  Psychiatric: She has a normal mood and affect. Her behavior is normal. Judgment and thought content normal.  Breasts/pelvic: deferred           Assessment & Plan:        Assessment & Plan:   Problem List Items Addressed This Visit       Unprioritized   Routine general medical  examination at a health care facility - Primary    Discussed healthy diet, exercise, weight loss. Td today. Pap up to date. Recommended covid booster this fall.       Anxiety    Stable on zoloft. Continue same.       Other Visit Diagnoses     Hyperglycemia       Relevant Orders   Comp Met (CMET)   Hemoglobin A1c   Need for Td vaccine       Relevant Orders  Td : Tetanus/diphtheria >7yo Preservative  free (Completed)   Iron deficiency anemia, unspecified iron deficiency anemia type       Relevant Orders   CBC with Differential/Platelet       I have discontinued Aleathea Pixley's predniSONE. I am also having her maintain her sertraline, valACYclovir, Womens Multi Vitamin & Mineral, and albuterol.  No orders of the defined types were placed in this encounter.

## 2022-05-05 NOTE — Assessment & Plan Note (Signed)
Stable on zoloft. Continue same.  

## 2022-06-21 ENCOUNTER — Telehealth: Payer: Self-pay | Admitting: Family

## 2022-06-21 NOTE — Telephone Encounter (Signed)
Medication: sertraline (ZOLOFT) 50 MG tablet [[id number]]    valACYclovir (VALTREX) 1000 MG tablet   Has the patient contacted their pharmacy? Yes.   Out of refills   Preferred Pharmacy (with phone number or street name): [id number] - [place], [place] - [address] AT [facility] [address]  [address], [place] [address]  Phone:  [phone]  Fax:  [phone]   Agent: Please be advised that RX refills may take up to 3 business days. We ask that you follow-up with your pharmacy.

## 2022-06-23 ENCOUNTER — Other Ambulatory Visit: Payer: Self-pay

## 2022-06-23 MED ORDER — VALACYCLOVIR HCL 1 G PO TABS: 1000.0000 mg | ORAL_TABLET | Freq: Every day | ORAL | 1 refills | Status: DC

## 2022-06-23 MED ORDER — SERTRALINE HCL 50 MG PO TABS: ORAL_TABLET | ORAL | 1 refills | Status: DC

## 2022-06-23 NOTE — Telephone Encounter (Signed)
Refills sent

## 2022-07-12 ENCOUNTER — Telehealth: Payer: Self-pay | Admitting: Family

## 2022-07-12 DIAGNOSIS — Z309 Encounter for contraceptive management, unspecified: Secondary | ICD-10-CM

## 2022-07-12 NOTE — Telephone Encounter (Signed)
Okay to refill? Looks like this had been discontinued.

## 2022-07-12 NOTE — Telephone Encounter (Signed)
Needs lab visit for HCG prior to restarting. Order placed.

## 2022-07-12 NOTE — Telephone Encounter (Signed)
Medication: norelgestromin-ethinyl estradiol (ORTHO EVRA) 150-35 MCG/24HR transdermal patch [[id number]]  DISCONTINUED   Preferred Pharmacy (with phone number or street name):   Agent: Please be advised that RX refills may take up to 3 business days. We ask that you follow-up with your pharmacy.  [id number] - [place], [place] - [address] AT [facility] [address], [place] [address] Phone: [phone]  Fax: [phone]    Please advise patient if she needs another appt.

## 2022-07-13 NOTE — Telephone Encounter (Signed)
Patient scheduled for tomorrow

## 2022-07-14 ENCOUNTER — Other Ambulatory Visit: Payer: Commercial Managed Care - HMO

## 2022-07-14 ENCOUNTER — Encounter: Payer: Self-pay | Admitting: Family

## 2022-07-15 ENCOUNTER — Other Ambulatory Visit: Payer: Commercial Managed Care - HMO

## 2022-07-18 ENCOUNTER — Other Ambulatory Visit: Payer: Commercial Managed Care - HMO

## 2022-07-18 DIAGNOSIS — Z309 Encounter for contraceptive management, unspecified: Secondary | ICD-10-CM

## 2022-07-19 ENCOUNTER — Telehealth: Payer: Self-pay | Admitting: Family

## 2022-07-19 LAB — PREGNANCY, URINE: Preg Test, Ur: NEGATIVE

## 2022-07-19 MED ORDER — NORELGESTROMIN-ETH ESTRADIOL 150-35 MCG/24HR TD PTWK: 1.0000 | MEDICATED_PATCH | TRANSDERMAL | 4 refills | Status: DC

## 2022-07-19 NOTE — Telephone Encounter (Signed)
See mychart.  

## 2022-07-21 NOTE — Telephone Encounter (Signed)
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 Please advise.

## 2022-07-22 MED ORDER — NORETHIN ACE-ETH ESTRAD-FE 1.5-30 MG-MCG PO TABS: 1.0000 | ORAL_TABLET | Freq: Every day | ORAL | 11 refills | Status: DC

## 2022-07-22 NOTE — Addendum Note (Signed)
Addended by: Debbrah Alar on: 07/22/2022 01:02 PM   Modules accepted: Orders

## 2022-08-08 ENCOUNTER — Encounter: Payer: Self-pay | Admitting: Family

## 2022-08-09 ENCOUNTER — Encounter: Payer: Self-pay | Admitting: Family

## 2022-08-12 ENCOUNTER — Ambulatory Visit: Payer: Commercial Managed Care - HMO | Admitting: Family

## 2022-08-15 ENCOUNTER — Encounter: Payer: Self-pay | Admitting: Family

## 2022-08-17 ENCOUNTER — Telehealth: Payer: Self-pay

## 2022-08-17 NOTE — Telephone Encounter (Signed)
Caller Name Ladina Shutters Caller Phone Number [phone] Patient Name Rebekah Carter Patient DOB 1996/02/03 Call Type Message Only Information Provided Reason for Call Request for General Office Information Initial Comment Caller states she has a letter on her chart about dismissing her due to no longer taking her insurance. Additional Comment Office hours provided. Disp. Time Disposition Final User 08/17/2022 12:50:24 PM General Information Provided Yes Dorcas Carrow Call Closed By: Dorcas Carrow Transaction Date/Time: 08/17/2022 12:48:02 PM (ET)

## 2022-08-17 NOTE — Telephone Encounter (Signed)
Pt called to follow up on reason for dismissal letter. Reached out to [place] to provide best way to proceed. Marchelle Folks was unavailable at the time and advised pt a message would be sent back to look into this matter. Pt acknowledged understanding.

## 2022-08-17 NOTE — Telephone Encounter (Signed)
Patient advised of office policy and her multiple no show and cancelled appointments. She will like to talk to the office manager to see if there is a way to reinstate her as a patient. Asking for a call back from office manager.

## 2022-09-16 ENCOUNTER — Encounter: Payer: Self-pay | Admitting: Family Medicine

## 2022-09-16 ENCOUNTER — Ambulatory Visit (INDEPENDENT_AMBULATORY_CARE_PROVIDER_SITE_OTHER): Payer: Commercial Managed Care - HMO | Admitting: Family Medicine

## 2022-09-16 VITALS — BP 105/93 | HR 88 | Ht 64.0 in | Wt 188.0 lb

## 2022-09-16 DIAGNOSIS — D509 Iron deficiency anemia, unspecified: Secondary | ICD-10-CM | POA: Diagnosis not present

## 2022-09-16 DIAGNOSIS — L609 Nail disorder, unspecified: Secondary | ICD-10-CM | POA: Insufficient documentation

## 2022-09-16 DIAGNOSIS — A6004 Herpesviral vulvovaginitis: Secondary | ICD-10-CM

## 2022-09-16 DIAGNOSIS — Z23 Encounter for immunization: Secondary | ICD-10-CM

## 2022-09-16 DIAGNOSIS — F321 Major depressive disorder, single episode, moderate: Secondary | ICD-10-CM | POA: Diagnosis not present

## 2022-09-16 DIAGNOSIS — F419 Anxiety disorder, unspecified: Secondary | ICD-10-CM | POA: Diagnosis not present

## 2022-09-16 MED ORDER — SERTRALINE HCL 50 MG PO TABS: ORAL_TABLET | ORAL | 1 refills | Status: DC

## 2022-09-16 NOTE — Progress Notes (Signed)
New Patient Office Visit  Subjective    Patient ID: Rebekah Carter, female    DOB: 1996-04-19  Age: 26 y.o. MRN: [id number]  CC:  Chief Complaint  Patient presents with   Establish Care    HPI Rebekah Carter presents to establish care  She has concerns today of her iron deficiency anemia. She was seen at [facility]. She notes heavy periods.   She also has concerns of a black line on her toe. She notes it has been there for a few years and has noticed that is has increased. Denies trauma to area.  She is taking zoloft 50mg   - both GAD and MDD - needs refills.   Genital herpes - has not had a flare up in over a year. Is taking medication daily but has missed doses for a few weeks and has not noticed any flare up  Outpatient Encounter Medications as of 09/16/2022  Medication Sig   albuterol (VENTOLIN HFA) 108 (90 Base) MCG/ACT inhaler Inhale 2 puffs into the lungs every 6 (six) hours as needed for wheezing or shortness of breath.   Multiple Vitamins-Minerals (WOMENS MULTI VITAMIN & MINERAL) TABS Take 1 tablet by mouth daily.   norethindrone-ethinyl estradiol-iron (MICROGESTIN FE 1.5/30) 1.5-30 MG-MCG tablet Take 1 tablet by mouth daily.   valACYclovir (VALTREX) 1000 MG tablet Take 1 tablet (1,000 mg total) by mouth daily.   [DISCONTINUED] sertraline (ZOLOFT) 50 MG tablet TAKE 1 TABLET(50 MG) BY MOUTH DAILY   sertraline (ZOLOFT) 50 MG tablet TAKE 1 TABLET(50 MG) BY MOUTH DAILY   No facility-administered encounter medications on file as of 09/16/2022.    Past Medical History:  Diagnosis Date   Anemia    Anxiety    Asthma    as a child    Refusal of blood transfusions as patient is Jehovah's Witness 12/17/2019   Seizures (HCC)    last seizure 8 years ago     Past Surgical History:  Procedure Laterality Date   LUMBAR LAMINECTOMY/DECOMPRESSION MICRODISCECTOMY Right 12/02/2015   Procedure: CENTRAL DECOMPRESSION L5 - S1 AND L4 - L5 FOR SPINAL  STENOSIS, MICRODISCECTOMY L4-L5, L5-S1, FORAMINOTOMIES L5 ADN S1 ROOT ON THE RIGHT;  Surgeon: 12/04/2015, MD;  Location: [facility];  Service: Orthopedics;  Laterality: Right;   TONSILLECTOMY AND ADENOIDECTOMY  2004    Family History  Problem Relation Age of Onset   Diabetes Maternal Grandfather    Hypertension Maternal Grandfather    Diabetes Paternal Grandfather    Hypertension Paternal Grandfather     Social History   Socioeconomic History   Marital status: Single    Spouse name: Not on file   Number of children: Not on file   Years of education: Not on file   Highest education level: High school graduate  Occupational History   Not on file  Tobacco Use   Smoking status: Never   Smokeless tobacco: Never  Vaping Use   Vaping Use: Some days  Substance and Sexual Activity   Alcohol use: No   Drug use: No   Sexual activity: Yes    Partners: Male    Birth control/protection: Condom  Other Topics Concern   Not on file  Social History Narrative   Works at the [employer] in [place] 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      Social Determinants of Health   Financial Resource Strain: Not  on file  Food Insecurity: Not on file  Transportation Needs: Not on file  Physical Activity: Not on file  Stress: Not on file  Social Connections: Not on file  Intimate Partner Violence: Not on file    Review of Systems  Constitutional:  Negative for chills and fever.  Respiratory:  Negative for cough and shortness of breath.   Cardiovascular:  Negative for chest pain.  Neurological:  Negative for headaches.        Objective    BP (!) 105/93   Pulse 88   Ht 5\' 4"  (1.626 m)   Wt 188 lb (85.3 kg)   SpO2 100%   BMI 32.27 kg/m   Physical Exam Vitals and nursing note reviewed.  Constitutional:      General: She is not in acute distress.    Appearance: Normal appearance.  HENT:     Head: Normocephalic and  atraumatic.     Right Ear: External ear normal.     Left Ear: External ear normal.     Nose: Nose normal.  Eyes:     Conjunctiva/sclera: Conjunctivae normal.  Cardiovascular:     Rate and Rhythm: Normal rate and regular rhythm.  Pulmonary:     Effort: Pulmonary effort is normal.     Breath sounds: Normal breath sounds.  Skin:    Comments: Right fourth toenail is dark black  Neurological:     General: No focal deficit present.     Mental Status: She is alert and oriented to person, place, and time.  Psychiatric:        Mood and Affect: Mood normal.        Behavior: Behavior normal.        Thought Content: Thought content normal.        Judgment: Judgment normal.        Assessment & Plan:   Problem List Items Addressed This Visit       Musculoskeletal and Integument   Nail disorder    - toenail is black and looks like a contusion or just hyper melanin production in the nail. However due to reported progression of black nail discoloration will send her to dermatology to rule out melanoma of toenail      Relevant Orders   Ambulatory referral to Dermatology     Genitourinary   Herpes, genital    - since patient has missed weeks of dosing and has not had a flare up we will try and do episodic dosing at this time. Gave patient instructions for how to take during a flare up and if she notices more frequent flare ups will need to go to daily prophylactic medication        Other   Iron deficiency anemia - Primary    - pt has had iron infusions in the past per her report  - encouraged her to reach out to hematology to discuss with them about her next infusion date because she said she did miss follow ups with them.  - have ordered CBC and iron panel to assess how quickly she needs to get into hematology      Relevant Orders   CBC   Fe+TIBC+Fer   Anxiety    - have refilled zoloft 50mg         Relevant Medications   sertraline (ZOLOFT) 50 MG tablet   Current moderate  episode of major depressive disorder without prior episode (HCC)    - have sent refill for zoloft  Relevant Medications   sertraline (ZOLOFT) 50 MG tablet    No follow-ups on file.   Charlton Amor, DO

## 2022-09-16 NOTE — Assessment & Plan Note (Signed)
-   pt has had iron infusions in the past per her report  - encouraged her to reach out to hematology to discuss with them about her next infusion date because she said she did miss follow ups with them.  - have ordered CBC and iron panel to assess how quickly she needs to get into hematology

## 2022-09-16 NOTE — Patient Instructions (Addendum)
For Flare Ups: Take 1000mg  (1 tablet) by mouth daily for 5 daily.

## 2022-09-16 NOTE — Assessment & Plan Note (Signed)
-   toenail is black and looks like a contusion or just hyper melanin production in the nail. However due to reported progression of black nail discoloration will send her to dermatology to rule out melanoma of toenail

## 2022-09-16 NOTE — Assessment & Plan Note (Signed)
-   have sent refill for zoloft

## 2022-09-16 NOTE — Assessment & Plan Note (Signed)
-   have refilled zoloft 50mg

## 2022-09-16 NOTE — Assessment & Plan Note (Signed)
-   since patient has missed weeks of dosing and has not had a flare up we will try and do episodic dosing at this time. Gave patient instructions for how to take during a flare up and if she notices more frequent flare ups will need to go to daily prophylactic medication

## 2022-09-16 NOTE — Addendum Note (Signed)
Addended by: Howard Pouch on: 09/16/2022 11:04 AM   Modules accepted: Orders

## 2022-09-17 LAB — IRON,TIBC AND FERRITIN PANEL
%SAT: 7 % (calc) — ABNORMAL LOW (ref 16–45)
Ferritin: 12 ng/mL — ABNORMAL LOW (ref 16–154)
Iron: 31 ug/dL — ABNORMAL LOW (ref 40–190)
TIBC: 431 mcg/dL (calc) (ref 250–450)

## 2022-09-17 LAB — CBC
HCT: 37 % (ref 35.0–45.0)
Hemoglobin: 11.7 g/dL (ref 11.7–15.5)
MCH: 26.5 pg — ABNORMAL LOW (ref 27.0–33.0)
MCHC: 31.6 g/dL — ABNORMAL LOW (ref 32.0–36.0)
MCV: 83.7 fL (ref 80.0–100.0)
MPV: 10.9 fL (ref 7.5–12.5)
Platelets: 260 10*3/uL (ref 140–400)
RBC: 4.42 10*6/uL (ref 3.80–5.10)
RDW: 15.1 % — ABNORMAL HIGH (ref 11.0–15.0)
WBC: 7.9 10*3/uL (ref 3.8–10.8)

## 2022-11-15 ENCOUNTER — Telehealth: Payer: Self-pay | Admitting: Family Medicine

## 2022-11-15 NOTE — Telephone Encounter (Signed)
Pt is a Dr. Mel Almond pt. Pt called asking for a refill of valACYclovir (VALTREX) 1000 MG tablet [[id number]] .

## 2022-11-16 ENCOUNTER — Other Ambulatory Visit: Payer: Self-pay | Admitting: *Deleted

## 2022-11-16 MED ORDER — VALACYCLOVIR HCL 1 G PO TABS: 1000.0000 mg | ORAL_TABLET | Freq: Every day | ORAL | 1 refills | Status: DC

## 2022-11-17 ENCOUNTER — Encounter: Payer: Self-pay | Admitting: Family

## 2022-11-23 ENCOUNTER — Encounter: Payer: Self-pay | Admitting: Physician Assistant

## 2022-11-23 ENCOUNTER — Other Ambulatory Visit (HOSPITAL_COMMUNITY)
Admission: RE | Admit: 2022-11-23 | Discharge: 2022-11-23 | Disposition: A | Discharge status: Home / Self Care | Payer: Commercial Managed Care - HMO | Source: Ambulatory Visit | Attending: Physician Assistant | Admitting: Physician Assistant

## 2022-11-23 ENCOUNTER — Ambulatory Visit (INDEPENDENT_AMBULATORY_CARE_PROVIDER_SITE_OTHER): Payer: Commercial Managed Care - HMO | Admitting: Physician Assistant

## 2022-11-23 VITALS — BP 128/67 | HR 79 | Ht 64.0 in | Wt 186.0 lb

## 2022-11-23 DIAGNOSIS — Z3141 Encounter for fertility testing: Secondary | ICD-10-CM

## 2022-11-23 DIAGNOSIS — Z124 Encounter for screening for malignant neoplasm of cervix: Secondary | ICD-10-CM | POA: Diagnosis present

## 2022-11-23 DIAGNOSIS — Z0000 Encounter for general adult medical examination without abnormal findings: Secondary | ICD-10-CM

## 2022-11-23 DIAGNOSIS — Z13220 Encounter for screening for lipoid disorders: Secondary | ICD-10-CM | POA: Diagnosis not present

## 2022-11-23 DIAGNOSIS — A6000 Herpesviral infection of urogenital system, unspecified: Secondary | ICD-10-CM

## 2022-11-23 DIAGNOSIS — Z131 Encounter for screening for diabetes mellitus: Secondary | ICD-10-CM

## 2022-11-23 MED ORDER — VALACYCLOVIR HCL 1 G PO TABS: 1000.0000 mg | ORAL_TABLET | Freq: Every day | ORAL | 1 refills | Status: DC

## 2022-11-23 NOTE — Patient Instructions (Signed)

## 2022-11-23 NOTE — Progress Notes (Signed)
Complete physical exam  Patient: Rebekah Carter   DOB: 04-24-1996   27 y.o. Female  MRN: [id number]  Subjective:    Chief Complaint  Patient presents with   Annual Exam    Rebekah Carter is a 27 y.o. female who presents today for a complete physical exam. She reports consuming a general diet. The patient does not participate in regular exercise at present. She generally feels well. She reports sleeping well. She does have additional problems to discuss today.   Pt would like pap and STD testing today. She would like referral to OB/GYN to discuss fertility.    Most recent fall risk assessment:    11/23/2022    2:01 PM  Fall Risk   Falls in the past year? 0  Number falls in past yr: 0  Injury with Fall? 0  Risk for fall due to : No Fall Risks  Follow up Falls evaluation completed     Most recent depression screenings:    11/23/2022    2:01 PM 09/16/2022    4:14 PM  PHQ 2/9 Scores  PHQ - 2 Score 0 2  PHQ- 9 Score  4    Vision:Within last year and Dental: No current dental problems  Patient Active Problem List   Diagnosis Date Noted   Nail disorder 09/16/2022   Current moderate episode of major depressive disorder without prior episode ([place]) 09/16/2022   Anxiety 05/05/2022   Moderate asthma with acute exacerbation 12/22/2021   Contraceptive management 02/22/2021   Herpes, genital 02/22/2021   Iron deficiency anemia 09/29/2020   Refusal of blood transfusions as patient is Jehovah's Witness 12/17/2019   Spinal stenosis, lumbar region, 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   Social anxiety disorder 03/06/2015   Fatigue 01/26/2015   Leg length discrepancy 08/01/2014   Rash and nonspecific skin eruption 05/23/2014   Lumbar disc disease 02/12/2014   Hypopigmentation 02/12/2014   Routine general medical examination at a health care facility 12/11/2013   Latex allergy, contact dermatitis 11/20/2013   MENORRHAGIA 03/27/2009   SYNCOPE 03/27/2009   DERMATITIS,  ATOPIC 07/25/2008   SEIZURE DISORDER 11/27/2007   Past Medical History:  Diagnosis Date   Anemia    Anxiety    Asthma    as a child    Refusal of blood transfusions as patient is Jehovah's Witness 12/17/2019   Seizures ([place])    last seizure 8 years ago    Family History  Problem Relation Age of Onset   Diabetes Maternal Grandfather    Hypertension Maternal Grandfather    Diabetes Paternal Grandfather    Hypertension Paternal Grandfather       Patient Care Team: Owens Loffler, DO as PCP - General (Family Medicine)   Outpatient Medications Prior to Visit  Medication Sig   albuterol (VENTOLIN HFA) 108 (90 Base) MCG/ACT inhaler Inhale 2 puffs into the lungs every 6 (six) hours as needed for wheezing or shortness of breath.   Multiple Vitamins-Minerals (WOMENS MULTI VITAMIN & MINERAL) TABS Take 1 tablet by mouth daily.   sertraline (ZOLOFT) 50 MG tablet TAKE 1 TABLET(50 MG) BY MOUTH DAILY   [DISCONTINUED] norethindrone-ethinyl estradiol-iron (MICROGESTIN FE 1.5/30) 1.5-30 MG-MCG tablet Take 1 tablet by mouth daily.   [DISCONTINUED] valACYclovir (VALTREX) 1000 MG tablet Take 1 tablet (1,000 mg total) by mouth daily.   No facility-administered medications prior to visit.    ROS See HPI.      Objective:     BP 128/67 (BP Location: Right Arm, Patient Position:  Sitting, Cuff Size: Small)   Pulse 79   Ht 5' 4"$  (1.626 m)   Wt 186 lb (84.4 kg)   SpO2 94%   BMI 31.93 kg/m  BP Readings from Last 3 Encounters:  11/23/22 128/67  09/16/22 (!) 105/93  05/05/22 117/73   Wt Readings from Last 3 Encounters:  11/23/22 186 lb (84.4 kg)  09/16/22 188 lb (85.3 kg)  05/05/22 194 lb (88 kg)      Physical Exam  BP 128/67 (BP Location: Right Arm, Patient Position: Sitting, Cuff Size: Small)   Pulse 79   Ht 5' 4"$  (1.626 m)   Wt 186 lb (84.4 kg)   SpO2 94%   BMI 31.93 kg/m   General Appearance:    Alert, cooperative, no distress, appears stated age  Head:    Normocephalic,  without obvious abnormality, atraumatic  Eyes:    PERRL, conjunctiva/corneas clear, EOM's intact, fundi    benign, both eyes  Ears:    Normal TM's and external ear canals, both ears  Nose:   Nares normal, septum midline, mucosa normal, no drainage    or sinus tenderness  Throat:   Lips, mucosa, and tongue normal; teeth and gums normal  Neck:   Supple, symmetrical, trachea midline, no adenopathy;    thyroid:  no enlargement/tenderness/nodules; no carotid   bruit or JVD  Back:     Symmetric, no curvature, ROM normal, no CVA tenderness  Lungs:     Clear to auscultation bilaterally, respirations unlabored  Chest Wall:    No tenderness or deformity   Heart:    Regular rate and rhythm, S1 and S2 normal, no murmur, rub   or gallop  Breast Exam:    No tenderness, masses, or nipple abnormality  Abdomen:     Soft, non-tender, bowel sounds active all four quadrants,    no masses, no organomegaly  Genitalia:    Normal female without lesion, discharge or tenderness  Rectal:    No external concerns.   Extremities:   Extremities normal, atraumatic, no cyanosis or edema  Pulses:   2+ and symmetric all extremities  Skin:   Skin color, texture, turgor normal, no rashes or lesions  Lymph nodes:   Cervical, supraclavicular, and axillary nodes normal  Neurologic:   CNII-XII intact, normal strength, sensation and reflexes    throughout      Assessment & Plan:    Routine Health Maintenance and Physical Exam  Immunization History  Administered Date(s) Administered   HPV 9-valent 03/02/2020, 05/01/2020, 09/01/2020   Influenza Whole 07/25/2008   Influenza,inj,Quad PF,6+ Mos 11/20/2013, 08/25/2020, 10/06/2021, 09/16/2022   PFIZER Comirnaty(Gray Top)Covid-19 Tri-Sucrose Vaccine 01/11/2021   PFIZER(Purple Top)SARS-COV-2 Vaccination 10/19/2020   Pfizer Covid-19 Vaccine Bivalent Booster 39yr & up 10/06/2021   Td 05/05/2022   Tdap 10/10/2012    Health Maintenance  Topic Date Due   COVID-19 Vaccine (4 -  2023-24 season) 12/09/2022 (Originally 06/10/2022)   PAP-Cervical Cytology Screening  01/21/2024   PAP SMEAR-Modifier  01/21/2024   DTaP/Tdap/Td (3 - Td or Tdap) 05/05/2032   INFLUENZA VACCINE  Completed   HPV VACCINES  Completed   Hepatitis C Screening  Completed   HIV Screening  Completed    Discussed health benefits of physical activity, and encouraged her to engage in regular exercise appropriate for her age and condition.  .Marland KitchenLawerance Sabalwas seen today for annual exam.  Diagnoses and all orders for this visit:  Routine general medical examination at a health care facility -  Lipid Panel w/reflex Direct LDL -     COMPLETE METABOLIC PANEL WITH GFR -     TSH -     CBC w/Diff/Platelet  Screening for lipid disorders -     Lipid Panel w/reflex Direct LDL  Screening for diabetes mellitus -     COMPLETE METABOLIC PANEL WITH GFR  Papanicolaou smear -     Cytology - PAP  Fertility testing -     Ambulatory referral to Obstetrics / Gynecology  Genital herpes simplex, unspecified site -     valACYclovir (VALTREX) 1000 MG tablet; Take 1 tablet (1,000 mg total) by mouth daily.   .. Discussed 150 minutes of exercise a week.  Encouraged vitamin D 1000 units and Calcium 1372m or 4 servings of dairy a day.  Fasting labs ordered today PHQ no concerns Pap done with STD testing Not on OCP would like referral to discuss fertility with GYN Valtrex refilled Flu UTD Declined covid booster     JIran Planas PA-C

## 2022-11-28 LAB — CYTOLOGY - PAP
Chlamydia: POSITIVE — AB
Comment: NEGATIVE
Comment: NEGATIVE
Comment: NORMAL
Diagnosis: UNDETERMINED — AB
High risk HPV: POSITIVE — AB
Neisseria Gonorrhea: NEGATIVE

## 2022-11-29 ENCOUNTER — Other Ambulatory Visit: Payer: Self-pay | Admitting: Physician Assistant

## 2022-11-29 DIAGNOSIS — A749 Chlamydial infection, unspecified: Secondary | ICD-10-CM

## 2022-11-29 DIAGNOSIS — R87610 Atypical squamous cells of undetermined significance on cytologic smear of cervix (ASC-US): Secondary | ICD-10-CM | POA: Insufficient documentation

## 2022-11-29 MED ORDER — DOXYCYCLINE HYCLATE 100 MG PO TABS: 100.0000 mg | ORAL_TABLET | Freq: Two times a day (BID) | ORAL | 0 refills | Status: DC

## 2022-11-29 NOTE — Progress Notes (Signed)
Your pap has changed in the last 2 years. 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 Repeat pap in 1 year. You are also positive for chlamydia. I will send doxycycline to pharmacy. Make sure partners are aware and treated. Do not have any sexual intercourse in next 7 days until doxycycline is completed.

## 2022-11-30 ENCOUNTER — Encounter: Payer: Self-pay | Admitting: Physician Assistant

## 2022-11-30 ENCOUNTER — Ambulatory Visit: Payer: Commercial Managed Care - HMO | Admitting: Physician Assistant

## 2022-12-14 ENCOUNTER — Encounter: Payer: Self-pay | Admitting: Family

## 2022-12-15 ENCOUNTER — Ambulatory Visit: Payer: Commercial Managed Care - HMO

## 2022-12-16 ENCOUNTER — Ambulatory Visit: Payer: Commercial Managed Care - HMO

## 2022-12-22 ENCOUNTER — Ambulatory Visit (INDEPENDENT_AMBULATORY_CARE_PROVIDER_SITE_OTHER): Payer: Commercial Managed Care - HMO | Admitting: Family Medicine

## 2022-12-22 DIAGNOSIS — Z8619 Personal history of other infectious and parasitic diseases: Secondary | ICD-10-CM

## 2022-12-22 DIAGNOSIS — Z113 Encounter for screening for infections with a predominantly sexual mode of transmission: Secondary | ICD-10-CM

## 2022-12-22 NOTE — Progress Notes (Signed)
Pt is here today to self swab for STI clearance after ABX.

## 2022-12-22 NOTE — Progress Notes (Signed)
Medical screening examination/treatment was performed by qualified clinical staff member and as supervising physician I was immediately available for consultation/collaboration. I have reviewed documentation and agree with assessment and plan.  Azrael Maddix S. Abner Ardis, DO  

## 2022-12-24 LAB — SURESWAB® ADVANCED VAGINITIS PLUS,TMA
C. trachomatis RNA, TMA: NOT DETECTED
CANDIDA SPECIES: NOT DETECTED
Candida glabrata: NOT DETECTED
N. gonorrhoeae RNA, TMA: NOT DETECTED
SURESWAB(R) ADV BACTERIAL VAGINOSIS(BV),TMA: POSITIVE — AB
TRICHOMONAS VAGINALIS (TV),TMA: NOT DETECTED

## 2022-12-26 ENCOUNTER — Encounter: Payer: Self-pay | Admitting: Physician Assistant

## 2022-12-26 ENCOUNTER — Other Ambulatory Visit: Payer: Self-pay | Admitting: Physician Assistant

## 2022-12-26 MED ORDER — METRONIDAZOLE 500 MG PO TABS: 500.0000 mg | ORAL_TABLET | Freq: Two times a day (BID) | ORAL | 0 refills | Status: AC

## 2022-12-26 NOTE — Progress Notes (Signed)
Negative for yeast, Chlamydia and gonorrhea. You are positive for bacterial vaginosis. Will send metronidazole to treat.

## 2023-01-19 ENCOUNTER — Encounter: Payer: Commercial Managed Care - HMO | Admitting: Obstetrics & Gynecology

## 2023-02-15 ENCOUNTER — Ambulatory Visit (INDEPENDENT_AMBULATORY_CARE_PROVIDER_SITE_OTHER): Payer: Commercial Managed Care - HMO | Admitting: Family Medicine

## 2023-02-15 ENCOUNTER — Encounter: Payer: Self-pay | Admitting: Family Medicine

## 2023-02-15 VITALS — BP 106/69 | HR 86 | Resp 20 | Ht 64.0 in | Wt 188.2 lb

## 2023-02-15 DIAGNOSIS — F321 Major depressive disorder, single episode, moderate: Secondary | ICD-10-CM | POA: Diagnosis not present

## 2023-02-15 DIAGNOSIS — F419 Anxiety disorder, unspecified: Secondary | ICD-10-CM | POA: Diagnosis not present

## 2023-02-15 DIAGNOSIS — R5383 Other fatigue: Secondary | ICD-10-CM | POA: Diagnosis not present

## 2023-02-15 DIAGNOSIS — A6000 Herpesviral infection of urogenital system, unspecified: Secondary | ICD-10-CM

## 2023-02-15 DIAGNOSIS — D508 Other iron deficiency anemias: Secondary | ICD-10-CM

## 2023-02-15 MED ORDER — SERTRALINE HCL 50 MG PO TABS: 50.0000 mg | ORAL_TABLET | Freq: Every day | ORAL | 3 refills | Status: DC

## 2023-02-15 MED ORDER — VALACYCLOVIR HCL 1 G PO TABS: 1000.0000 mg | ORAL_TABLET | Freq: Every day | ORAL | 1 refills | Status: DC

## 2023-02-15 MED ORDER — SERTRALINE HCL 25 MG PO TABS: 25.0000 mg | ORAL_TABLET | Freq: Every day | ORAL | 3 refills | Status: DC

## 2023-02-15 MED ORDER — BUSPIRONE HCL 15 MG PO TABS: 15.0000 mg | ORAL_TABLET | Freq: Two times a day (BID) | ORAL | 0 refills | Status: DC | PRN

## 2023-02-15 NOTE — Assessment & Plan Note (Signed)
Due to fatigue, will check labs for vitamin D and iron

## 2023-02-15 NOTE — Progress Notes (Signed)
Established patient visit   Patient: Rebekah Carter   DOB: 09-27-1996   27 y.o. Female  MRN: [id number] Visit Date: 02/15/2023  Today's healthcare provider: Charlton Amor, DO   Chief Complaint  Patient presents with   Anxiety   Panic Attack    SUBJECTIVE    Chief Complaint  Patient presents with   Anxiety   Panic Attack   Anxiety Symptoms include nervous/anxious behavior. Patient reports no chest pain or shortness of breath.       Is for follow-up on anxiety.  She has been getting more panic attacks.  She is on Zoloft 50 mg but does not think this is working.  She also notes that her period was very heavy last month and has noticed a decrease in energy.  She has past medical history of iron deficiency anemia and is curious if her iron is off today.  She does have a mother who has a history of depression.  Review of Systems  Constitutional:  Negative for activity change, fatigue and fever.  Respiratory:  Negative for cough and shortness of breath.   Cardiovascular:  Negative for chest pain.  Gastrointestinal:  Negative for abdominal pain.  Genitourinary:  Negative for difficulty urinating.  Psychiatric/Behavioral:  The patient is nervous/anxious.        Current Meds  Medication Sig   albuterol (VENTOLIN HFA) 108 (90 Base) MCG/ACT inhaler Inhale 2 puffs into the lungs every 6 (six) hours as needed for wheezing or shortness of breath.   busPIRone (BUSPAR) 15 MG tablet Take 1 tablet (15 mg total) by mouth 2 (two) times daily as needed (panic attack).   Multiple Vitamins-Minerals (WOMENS MULTI VITAMIN & MINERAL) TABS Take 1 tablet by mouth daily.   sertraline (ZOLOFT) 25 MG tablet Take 1 tablet (25 mg total) by mouth daily.   sertraline (ZOLOFT) 50 MG tablet Take 1 tablet (50 mg total) by mouth daily.   [DISCONTINUED] sertraline (ZOLOFT) 50 MG tablet TAKE 1 TABLET(50 MG) BY MOUTH DAILY   [DISCONTINUED] valACYclovir (VALTREX) 1000 MG tablet Take 1 tablet (1,000 mg  total) by mouth daily.    OBJECTIVE    BP 106/69 (BP Location: Left Arm, Cuff Size: Normal)   Pulse 86   Resp 20   Ht 5\' 4"  (1.626 m)   Wt 188 lb 3.2 oz (85.4 kg)   SpO2 99%   BMI 32.30 kg/m   Physical Exam Vitals and nursing note reviewed.  Constitutional:      General: She is not in acute distress.    Appearance: Normal appearance.  HENT:     Head: Normocephalic and atraumatic.     Right Ear: External ear normal.     Left Ear: External ear normal.     Nose: Nose normal.  Eyes:     Conjunctiva/sclera: Conjunctivae normal.  Cardiovascular:     Rate and Rhythm: Normal rate and regular rhythm.  Pulmonary:     Effort: Pulmonary effort is normal.     Breath sounds: Normal breath sounds.  Neurological:     General: No focal deficit present.     Mental Status: She is alert and oriented to person, place, and time.  Psychiatric:        Mood and Affect: Mood normal.        Behavior: Behavior normal.        Thought Content: Thought content normal.        Judgment: Judgment normal.  ASSESSMENT & PLAN    Problem List Items Addressed This Visit       Genitourinary   Herpes, genital   Relevant Medications   valACYclovir (VALTREX) 1000 MG tablet     Other   Fatigue    Due to fatigue, will check labs for vitamin D and iron      Relevant Orders   Vitamin D (25 hydroxy)   Iron deficiency anemia    Has a history of iron deficiency anemia and has been feeling more fatigued.  Does note heavier period.  Will go ahead and order iron studies today. If abnormal, patient may need iron infusion.      Relevant Orders   CBC   Iron, TIBC and Ferritin Panel   Anxiety - Primary    To 75 mg.  Will also go ahead and add BuSpar 15 mg to current regimen.  I did explain to patient that we can take this in increments of 5, 7.5, 10 or 15 based on how anxious she has.  Follow-up in 1 month for efficacy.      Relevant Medications   sertraline (ZOLOFT) 25 MG tablet   sertraline  (ZOLOFT) 50 MG tablet   busPIRone (BUSPAR) 15 MG tablet   Other Relevant Orders   TSH + free T4   Ambulatory referral to Behavioral Health   Current moderate episode of major depressive disorder without prior episode (HCC)    Zoloft to 75 mg.  Will also add counseling to current regimen to see if we can help control patient's depression      Relevant Medications   sertraline (ZOLOFT) 25 MG tablet   sertraline (ZOLOFT) 50 MG tablet   busPIRone (BUSPAR) 15 MG tablet   Other Relevant Orders   Ambulatory referral to Behavioral Health    Return in about 4 weeks (around 03/15/2023).      Meds ordered this encounter  Medications   valACYclovir (VALTREX) 1000 MG tablet    Sig: Take 1 tablet (1,000 mg total) by mouth daily.    Dispense:  90 tablet    Refill:  1   sertraline (ZOLOFT) 25 MG tablet    Sig: Take 1 tablet (25 mg total) by mouth daily.    Dispense:  30 tablet    Refill:  3   sertraline (ZOLOFT) 50 MG tablet    Sig: Take 1 tablet (50 mg total) by mouth daily.    Dispense:  30 tablet    Refill:  3   busPIRone (BUSPAR) 15 MG tablet    Sig: Take 1 tablet (15 mg total) by mouth 2 (two) times daily as needed (panic attack).    Dispense:  30 tablet    Refill:  0    Orders Placed This Encounter  Procedures   CBC   Iron, TIBC and Ferritin Panel   Vitamin D (25 hydroxy)   TSH + free T4   Ambulatory referral to Behavioral Health    Referral Priority:   Routine    Referral Type:   Psychiatric    Referral Reason:   Specialty Services Required    Requested Specialty:   Behavioral Health    Number of Visits Requested:   1     Charlton Amor, DO  [facility] at [facility] [phone] (phone) [phone] (fax)  [facility]

## 2023-02-15 NOTE — Assessment & Plan Note (Signed)
Has a history of iron deficiency anemia and has been feeling more fatigued.  Does note heavier period.  Will go ahead and order iron studies today. If abnormal, patient may need iron infusion.

## 2023-02-15 NOTE — Assessment & Plan Note (Signed)
Zoloft to 75 mg.  Will also add counseling to current regimen to see if we can help control patient's depression

## 2023-02-15 NOTE — Assessment & Plan Note (Signed)
To 75 mg.  Will also go ahead and add BuSpar 15 mg to current regimen.  I did explain to patient that we can take this in increments of 5, 7.5, 10 or 15 based on how anxious she has.  Follow-up in 1 month for efficacy.

## 2023-02-16 ENCOUNTER — Encounter: Payer: Commercial Managed Care - HMO | Admitting: Obstetrics and Gynecology

## 2023-02-16 LAB — IRON,TIBC AND FERRITIN PANEL
%SAT: 6 % (calc) — ABNORMAL LOW (ref 16–45)
Ferritin: 7 ng/mL — ABNORMAL LOW (ref 16–154)
Iron: 29 ug/dL — ABNORMAL LOW (ref 40–190)
TIBC: 459 mcg/dL (calc) — ABNORMAL HIGH (ref 250–450)

## 2023-02-16 LAB — CBC
HCT: 36.2 % (ref 35.0–45.0)
Hemoglobin: 11.2 g/dL — ABNORMAL LOW (ref 11.7–15.5)
MCH: 25.1 pg — ABNORMAL LOW (ref 27.0–33.0)
MCHC: 30.9 g/dL — ABNORMAL LOW (ref 32.0–36.0)
MCV: 81.2 fL (ref 80.0–100.0)
MPV: 11.5 fL (ref 7.5–12.5)
Platelets: 295 10*3/uL (ref 140–400)
RBC: 4.46 10*6/uL (ref 3.80–5.10)
RDW: 13.9 % (ref 11.0–15.0)
WBC: 8.5 10*3/uL (ref 3.8–10.8)

## 2023-02-16 LAB — TSH+FREE T4: TSH W/REFLEX TO FT4: 0.96 mIU/L

## 2023-02-16 LAB — VITAMIN D 25 HYDROXY (VIT D DEFICIENCY, FRACTURES): Vit D, 25-Hydroxy: 22 ng/mL — ABNORMAL LOW (ref 30–100)

## 2023-02-17 ENCOUNTER — Other Ambulatory Visit: Payer: Self-pay | Admitting: Family Medicine

## 2023-02-17 DIAGNOSIS — D508 Other iron deficiency anemias: Secondary | ICD-10-CM

## 2023-02-17 MED ORDER — CHOLECALCIFEROL 20 MCG (800 UNIT) PO TABS: 1.0000 | ORAL_TABLET | Freq: Every day | ORAL | 0 refills | Status: DC

## 2023-02-21 ENCOUNTER — Inpatient Hospital Stay: Payer: Commercial Managed Care - HMO

## 2023-02-21 ENCOUNTER — Inpatient Hospital Stay: Payer: Commercial Managed Care - HMO | Admitting: Medical Oncology

## 2023-02-28 ENCOUNTER — Encounter: Payer: Self-pay | Admitting: Medical Oncology

## 2023-02-28 ENCOUNTER — Encounter: Payer: Self-pay | Admitting: Family

## 2023-02-28 ENCOUNTER — Inpatient Hospital Stay (HOSPITAL_BASED_OUTPATIENT_CLINIC_OR_DEPARTMENT_OTHER): Payer: Commercial Managed Care - HMO | Admitting: Medical Oncology

## 2023-02-28 ENCOUNTER — Inpatient Hospital Stay: Payer: Commercial Managed Care - HMO | Attending: Hematology & Oncology

## 2023-02-28 VITALS — BP 112/48 | HR 63 | Temp 98.3°F | Resp 17 | Wt 187.0 lb

## 2023-02-28 DIAGNOSIS — E559 Vitamin D deficiency, unspecified: Secondary | ICD-10-CM | POA: Insufficient documentation

## 2023-02-28 DIAGNOSIS — D500 Iron deficiency anemia secondary to blood loss (chronic): Secondary | ICD-10-CM | POA: Diagnosis not present

## 2023-02-28 DIAGNOSIS — D509 Iron deficiency anemia, unspecified: Secondary | ICD-10-CM | POA: Insufficient documentation

## 2023-02-28 NOTE — Progress Notes (Signed)
[facility] [phone]   [phone]  INITIAL CONSULT NOTE  Patient Care Team: Charlton Amor, DO as PCP - General (Family Medicine)  CHIEF COMPLAINTS/PURPOSE OF CONSULTATION:  " Low iron "  HISTORY OF PRESENTING ILLNESS:  Rebekah Carter 27 y.o. female with medical history significant for multiple medical conditions including iron deficiency anemia. She was referred to [place] by Dr. Tamera Punt in family medicine for her iron deficiency anemia.   She reports that many years ago she was on oral iron. This did not work well and she subsequently had to have iron transfusions. She did well with these. She continues to have heavy menstrual cycles and off of the iron infusions/oral iron her levels have dropped once again.   Blood in stools:No Blood in urine: No Difficulty swallowing: No Pica: Yes SOB: No Fatigue: Yes Change of bowel movement/constipation: No Prior blood transfusion: No Prior history of blood loss: No Liver disease: No Bariatric surgery: No Vaginal bleeding: Regular cycles. Lasting 5-6 days. First few days are heavy and then progressively lighten Prior bone marrow biopsy: No Oral iron: Not currently - ineffective in the past  Prior IV iron infusions: Yes- did well.  Family history Anemia: Yes mother No known sickle cell or thalassemia  No Personal or family history of bleeding or clotting disorders.  Not on any special diets No upcoming planned pregnancies and not currently pregnant- No  MEDICAL HISTORY:  Past Medical History:  Diagnosis Date   Anemia    Anxiety    Asthma    as a child    Refusal of blood transfusions as patient is Jehovah's Witness 12/17/2019   Seizures (HCC)    last seizure 8 years ago     SURGICAL HISTORY: Past Surgical History:  Procedure Laterality Date   LUMBAR LAMINECTOMY/DECOMPRESSION MICRODISCECTOMY Right 12/02/2015   Procedure: CENTRAL DECOMPRESSION L5 - S1 AND L4 - L5 FOR SPINAL STENOSIS,  MICRODISCECTOMY L4-L5, L5-S1, FORAMINOTOMIES L5 ADN S1 ROOT ON THE RIGHT;  Surgeon: Ranee Gosselin, MD;  Location: [facility];  Service: Orthopedics;  Laterality: Right;   TONSILLECTOMY AND ADENOIDECTOMY  2004    SOCIAL HISTORY: Social History   Socioeconomic History   Marital status: Single    Spouse name: Not on file   Number of children: Not on file   Years of education: Not on file   Highest education level: High school graduate  Occupational History   Not on file  Tobacco Use   Smoking status: Never   Smokeless tobacco: Never  Vaping Use   Vaping Use: Some days  Substance and Sexual Activity   Alcohol use: No   Drug use: No   Sexual activity: Yes    Partners: Male    Birth control/protection: Condom  Other Topics Concern   Not on file  Social History Narrative   Works at the [employer] in [place] 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      Social Determinants of Health   Financial Resource Strain: Not on file  Food Insecurity: Not on file  Transportation Needs: Not on file  Physical Activity: Not on file  Stress: Not on file  Social Connections: Not on file  Intimate Partner Violence: Not on file    FAMILY HISTORY: Family History  Problem Relation Age of Onset   Diabetes Maternal Grandfather    Hypertension Maternal Grandfather    Diabetes Paternal Actor  Hypertension Paternal Grandfather     ALLERGIES:  is allergic to amoxicillin, latex, other, and peanut-containing drug products.  MEDICATIONS:  Current Outpatient Medications  Medication Sig Dispense Refill   albuterol (VENTOLIN HFA) 108 (90 Base) MCG/ACT inhaler Inhale 2 puffs into the lungs every 6 (six) hours as needed for wheezing or shortness of breath. 8 g 0   busPIRone (BUSPAR) 15 MG tablet Take 1 tablet (15 mg total) by mouth 2 (two) times daily as needed (panic attack). 30 tablet 0   Cholecalciferol 20 MCG (800 UNIT) TABS Take 1  tablet by mouth daily. 75 tablet 0   Multiple Vitamins-Minerals (WOMENS MULTI VITAMIN & MINERAL) TABS Take 1 tablet by mouth daily.     sertraline (ZOLOFT) 25 MG tablet Take 1 tablet (25 mg total) by mouth daily. 30 tablet 3   sertraline (ZOLOFT) 50 MG tablet Take 1 tablet (50 mg total) by mouth daily. 30 tablet 3   valACYclovir (VALTREX) 1000 MG tablet Take 1 tablet (1,000 mg total) by mouth daily. 90 tablet 1   No current facility-administered medications for this visit.    REVIEW OF SYSTEMS:   Constitutional: ( - ) fevers, ( - )  chills , ( - ) night sweats Eyes: ( - ) blurriness of vision, ( - ) double vision, ( - ) watery eyes Ears, nose, mouth, throat, and face: ( - ) mucositis, ( - ) sore throat Respiratory: ( - ) cough, ( - ) dyspnea, ( - ) wheezes Cardiovascular: ( - ) palpitation, ( - ) chest discomfort, ( - ) lower extremity swelling Gastrointestinal:  ( - ) nausea, ( - ) heartburn, ( - ) change in bowel habits Skin: ( - ) abnormal skin rashes Lymphatics: ( - ) new lymphadenopathy, ( - ) easy bruising Neurological: ( - ) numbness, ( - ) tingling, ( - ) new weaknesses Behavioral/Psych: ( - ) mood change, ( - ) new changes  All other systems were reviewed with the patient and are negative.  PHYSICAL EXAMINATION: ECOG PERFORMANCE STATUS: 1 - Symptomatic but completely ambulatory  Vitals:   02/28/23 1430  BP: (!) 112/48  Pulse: 63  Resp: 17  Temp: 98.3 F (36.8 C)  SpO2: 98%   Filed Weights   02/28/23 1430  Weight: 187 lb (84.8 kg)    GENERAL: well appearing female in NAD  SKIN: skin color, texture, turgor are normal, no rashes or significant lesions EYES: conjunctiva are pink and non-injected, sclera clear OROPHARYNX: no exudate, no erythema; lips, buccal mucosa, and tongue normal  NECK: supple, non-tender LYMPH:  no palpable lymphadenopathy in the cervical, axillary or supraclavicular lymph nodes.  LUNGS: clear to auscultation and percussion with normal breathing  effort HEART: regular rate & rhythm and no murmurs and no lower extremity edema ABDOMEN: soft, non-tender, non-distended, normal bowel sounds Musculoskeletal: no cyanosis of digits and no clubbing  PSYCH: alert & oriented x 3, fluent speech NEURO: no focal motor/sensory deficits  LABORATORY DATA:  I have reviewed the data as listed    Latest Ref Rng & Units 02/15/2023    2:04 PM 09/16/2022   10:48 AM 05/05/2022    1:10 PM  CBC  WBC 3.8 - 10.8 Thousand/uL 8.5  7.9  8.2   Hemoglobin 11.7 - 15.5 g/dL 16.1  09.6  04.5   Hematocrit 35.0 - 45.0 % 36.2  37.0  35.2   Platelets 140 - 400 Thousand/uL 295  260  245.0  Latest Ref Rng & Units 05/05/2022    1:10 PM 01/11/2021    9:42 AM 09/28/2020    3:02 PM  CMP  Glucose 70 - 99 mg/dL 91  93  161   BUN 6 - 23 mg/dL 11  13  11    Creatinine 0.40 - 1.20 mg/dL 0.96  0.45  4.09   Sodium 135 - 145 mEq/L 137  137  137   Potassium 3.5 - 5.1 mEq/L 4.0  4.1  3.8   Chloride 96 - 112 mEq/L 104  105  105   CO2 19 - 32 mEq/L 27  24  25    Calcium 8.4 - 10.5 mg/dL 9.3  9.1  9.7   Total Protein 6.0 - 8.3 g/dL 7.4  7.2  7.8   Total Bilirubin 0.2 - 1.2 mg/dL 0.4  0.4  0.3   Alkaline Phos 39 - 117 U/L 75  71  51   AST 0 - 37 U/L 16  15  16    ALT 0 - 35 U/L 17  17  13      ASSESSMENT & PLAN Encounter Diagnosis  Name Primary?   Iron deficiency anemia due to chronic blood loss Yes   New to me. Normocytic anemia with low iron counts. Appears secondary to her heavy menstrual cycles. She also has a history of vitamin D deficiency and suspected B12 deficiency. May be nutritional vs poor absorption. Our plan to day is to have her return for IV iron infusions and have her start a once daily B12 supplement. She will return in 1 month for lab recheck. I have also recommended that she discuss her menstrual cycles with her GYN and we will refer to GI/nutrition if she is also deficient in B12/Iron following re supplementation.   Disposition: IV Venofer 300 mg  weekly x 3 weeks total RTC 1 months APP, labs (CBC, retic, ferritin, iron, B12)-[place]   All questions were answered. The patient knows to call the clinic with any problems, questions or concerns.   Clent Jacks PA-C Department of Hematology/Oncology [facility] at [facility]

## 2023-03-08 ENCOUNTER — Encounter: Payer: Self-pay | Admitting: Family

## 2023-03-15 ENCOUNTER — Ambulatory Visit: Payer: Commercial Managed Care - HMO | Admitting: Family Medicine

## 2023-03-15 NOTE — Progress Notes (Deleted)
     Established patient visit   Patient: Rebekah Carter   DOB: 24-Oct-1995   26 y.o. Female  MRN: [id number] Visit Date: 03/15/2023  Today's healthcare provider: Charlton Amor, DO   No chief complaint on file.   SUBJECTIVE   No chief complaint on file.  HPI  IDA - seen by heme and given IV iron infusion   GAD and MDD - on zoloft 75mg  - added buspar 15mg  prn for panic attacks  - referred to counseling   Review of Systems  Constitutional:  Negative for activity change, fatigue and fever.  Respiratory:  Negative for cough and shortness of breath.   Cardiovascular:  Negative for chest pain.  Gastrointestinal:  Negative for abdominal pain.  Genitourinary:  Negative for difficulty urinating.       No outpatient medications have been marked as taking for the 03/15/23 encounter (Appointment) with Charlton Amor, DO.    OBJECTIVE    There were no vitals taken for this visit.  Physical Exam Vitals and nursing note reviewed.  Constitutional:      General: She is not in acute distress.    Appearance: Normal appearance.  HENT:     Head: Normocephalic and atraumatic.     Right Ear: External ear normal.     Left Ear: External ear normal.     Nose: Nose normal.  Eyes:     Conjunctiva/sclera: Conjunctivae normal.  Cardiovascular:     Rate and Rhythm: Normal rate and regular rhythm.  Pulmonary:     Effort: Pulmonary effort is normal.     Breath sounds: Normal breath sounds.  Neurological:     General: No focal deficit present.     Mental Status: She is alert and oriented to person, place, and time.  Psychiatric:        Mood and Affect: Mood normal.        Behavior: Behavior normal.        Thought Content: Thought content normal.        Judgment: Judgment normal.          ASSESSMENT & PLAN    Problem List Items Addressed This Visit   None   No follow-ups on file.      No orders of the defined types were placed in this encounter.   No orders of the  defined types were placed in this encounter.    Charlton Amor, DO  [facility] Medicine at [facility] [phone] (phone) [phone] (fax)  [facility]

## 2023-03-23 ENCOUNTER — Ambulatory Visit (INDEPENDENT_AMBULATORY_CARE_PROVIDER_SITE_OTHER): Payer: Self-pay | Admitting: Family Medicine

## 2023-03-23 ENCOUNTER — Encounter: Payer: Commercial Managed Care - HMO | Admitting: Obstetrics and Gynecology

## 2023-03-23 DIAGNOSIS — Z91199 Patient's noncompliance with other medical treatment and regimen due to unspecified reason: Secondary | ICD-10-CM

## 2023-03-23 NOTE — Progress Notes (Signed)
No show

## 2023-03-28 ENCOUNTER — Ambulatory Visit: Payer: Commercial Managed Care - HMO | Admitting: Family Medicine

## 2023-03-29 ENCOUNTER — Ambulatory Visit: Payer: Commercial Managed Care - HMO | Admitting: Family Medicine

## 2023-03-30 ENCOUNTER — Inpatient Hospital Stay: Payer: Commercial Managed Care - HMO | Attending: Medical Oncology

## 2023-03-30 ENCOUNTER — Inpatient Hospital Stay: Payer: Commercial Managed Care - HMO | Admitting: Medical Oncology

## 2023-03-30 NOTE — Progress Notes (Signed)
Erroneous encounter

## 2023-03-31 ENCOUNTER — Encounter: Payer: Self-pay | Admitting: Family

## 2023-04-03 ENCOUNTER — Ambulatory Visit: Payer: Commercial Managed Care - HMO | Admitting: Family Medicine

## 2023-04-04 NOTE — Progress Notes (Unsigned)
   GYNECOLOGY OFFICE VISIT NOTE  History:   Rebekah Carter is a 27 y.o. G0P0000 here today for discussion for multiple concerns.   She desires pregnancy.  She has heavy periods - She has had IV iron. Lowest hgb was 9.8. Most recent was 11.2. She is also a Jehovah's witness and would refuse blood.  Recently abnormal pap: ASCUS/HPV pos. She has had the Gardasil vaccine.  CT pos in February. S/p treatment.   She denies any abnormal vaginal discharge, bleeding, pelvic pain or other concerns.     Past Medical History:  Diagnosis Date   Anemia    Anxiety    Asthma    as a child    Refusal of blood transfusions as patient is Jehovah's Witness 12/17/2019   Seizures (HCC)    last seizure 8 years ago     Past Surgical History:  Procedure Laterality Date   LUMBAR LAMINECTOMY/DECOMPRESSION MICRODISCECTOMY Right 12/02/2015   Procedure: CENTRAL DECOMPRESSION L5 - S1 AND L4 - L5 FOR SPINAL STENOSIS, MICRODISCECTOMY L4-L5, L5-S1, FORAMINOTOMIES L5 ADN S1 ROOT ON THE RIGHT;  Surgeon: Ranee Gosselin, MD;  Location: [facility];  Service: Orthopedics;  Laterality: Right;   TONSILLECTOMY AND ADENOIDECTOMY  2004    The following portions of the patient's history were reviewed and updated as appropriate: allergies, current medications, past family history, past medical history, past social history, past surgical history and problem list.   Health Maintenance:   Diagnosis  Date Value Ref Range Status  11/23/2022 (A)  Final   - Atypical squamous cells of undetermined significance (ASC-US)    Review of Systems:  Pertinent items noted in HPI and remainder of comprehensive ROS otherwise negative.  Physical Exam:  There were no vitals taken for this visit. CONSTITUTIONAL: Well-developed, well-nourished female in no acute distress.  HEENT:  Normocephalic, atraumatic. External right and left ear normal. No scleral icterus.  NECK: Normal range of motion, supple, no masses noted on observation SKIN: No  rash noted. Not diaphoretic. No erythema. No pallor. MUSCULOSKELETAL: Normal range of motion. No edema noted. NEUROLOGIC: Alert and oriented to person, place, and time. Normal muscle tone coordination. No cranial nerve deficit noted. PSYCHIATRIC: Normal mood and affect. Normal behavior. Normal judgment and thought content.  PELVIC: Deferred  Labs and Imaging No results found for this or any previous visit (from the past 168 hour(s)). No results found.  Assessment and Plan:   1. Atypical squamous cell changes of undetermined significance (ASCUS) on cervical cytology with positive high risk human papilloma virus (HPV) Agree with pap and HPV in one year per her PCP's recommendations.   2. MENORRHAGIA Check TVUS for structural causes.  Otherwise, given desire for children, would advise TXA until conception. ***  3. Refusal of blood transfusions as patient is Jehovah's Witness Discussed risks of pregnancy and hemorrhage risks. ***  4. Seizure (HCC) Clarified history: ***  5. Patient desires pregnancy ***    Diagnoses and all orders for this visit:  Atypical squamous cell changes of undetermined significance (ASCUS) on cervical cytology with positive high risk human papilloma virus (HPV)  MENORRHAGIA  Refusal of blood transfusions as patient is Jehovah's Witness  Seizure [facility])  Patient desires pregnancy    Routine preventative health maintenance measures emphasized. Please refer to After Visit Summary for other counseling recommendations.   No follow-ups on file.  Milas Hock, MD, FACOG Obstetrician & Gynecologist, [facility] for [facility], [facility]

## 2023-04-06 ENCOUNTER — Encounter: Payer: Self-pay | Admitting: Obstetrics and Gynecology

## 2023-04-06 ENCOUNTER — Encounter: Payer: Self-pay | Admitting: Family

## 2023-04-06 ENCOUNTER — Ambulatory Visit (INDEPENDENT_AMBULATORY_CARE_PROVIDER_SITE_OTHER): Payer: Commercial Managed Care - HMO | Admitting: Obstetrics and Gynecology

## 2023-04-06 VITALS — BP 111/67 | HR 60 | Ht 64.0 in | Wt 187.0 lb

## 2023-04-06 DIAGNOSIS — Z319 Encounter for procreative management, unspecified: Secondary | ICD-10-CM

## 2023-04-06 DIAGNOSIS — Z531 Procedure and treatment not carried out because of patient's decision for reasons of belief and group pressure: Secondary | ICD-10-CM | POA: Diagnosis not present

## 2023-04-06 DIAGNOSIS — R569 Unspecified convulsions: Secondary | ICD-10-CM | POA: Diagnosis not present

## 2023-04-06 DIAGNOSIS — R87810 Cervical high risk human papillomavirus (HPV) DNA test positive: Secondary | ICD-10-CM | POA: Diagnosis not present

## 2023-04-06 DIAGNOSIS — R87610 Atypical squamous cells of undetermined significance on cytologic smear of cervix (ASC-US): Secondary | ICD-10-CM

## 2023-04-06 DIAGNOSIS — N920 Excessive and frequent menstruation with regular cycle: Secondary | ICD-10-CM | POA: Diagnosis not present

## 2023-04-06 MED ORDER — TRANEXAMIC ACID 650 MG PO TABS
1300.0000 mg | ORAL_TABLET | Freq: Three times a day (TID) | ORAL | 2 refills | Status: DC
Start: 2023-04-06 — End: 2024-05-22

## 2023-04-07 ENCOUNTER — Ambulatory Visit (INDEPENDENT_AMBULATORY_CARE_PROVIDER_SITE_OTHER): Payer: Commercial Managed Care - HMO | Admitting: Family Medicine

## 2023-04-07 ENCOUNTER — Encounter: Payer: Self-pay | Admitting: Family Medicine

## 2023-04-07 VITALS — BP 118/52 | HR 74 | Resp 20 | Ht 64.0 in | Wt 190.2 lb

## 2023-04-07 DIAGNOSIS — A6000 Herpesviral infection of urogenital system, unspecified: Secondary | ICD-10-CM | POA: Diagnosis not present

## 2023-04-07 DIAGNOSIS — F321 Major depressive disorder, single episode, moderate: Secondary | ICD-10-CM | POA: Diagnosis not present

## 2023-04-07 DIAGNOSIS — Z30011 Encounter for initial prescription of contraceptive pills: Secondary | ICD-10-CM | POA: Diagnosis not present

## 2023-04-07 DIAGNOSIS — F419 Anxiety disorder, unspecified: Secondary | ICD-10-CM

## 2023-04-07 MED ORDER — NORGESTIMATE-ETH ESTRADIOL 0.25-35 MG-MCG PO TABS
1.0000 | ORAL_TABLET | Freq: Every day | ORAL | 11 refills | Status: DC
Start: 2023-04-07 — End: 2024-05-22

## 2023-04-07 MED ORDER — SERTRALINE HCL 50 MG PO TABS: 50.0000 mg | ORAL_TABLET | Freq: Every day | ORAL | 1 refills | Status: DC

## 2023-04-07 MED ORDER — SERTRALINE HCL 25 MG PO TABS: 25.0000 mg | ORAL_TABLET | Freq: Every day | ORAL | 1 refills | Status: DC

## 2023-04-07 MED ORDER — VALACYCLOVIR HCL 1 G PO TABS: 1000.0000 mg | ORAL_TABLET | Freq: Every day | ORAL | 1 refills | Status: DC

## 2023-04-07 NOTE — Progress Notes (Signed)
Established patient visit   Patient: Rebekah Carter   DOB: 1996-07-30   26 y.o. Female  MRN: [id number] Visit Date: 04/07/2023  Today's healthcare provider: Charlton Amor, DO   Chief Complaint  Patient presents with   Medication Refill   Follow-up    SUBJECTIVE    Chief Complaint  Patient presents with   Medication Refill   Follow-up   HPI   GAD - on zoloft 75mg   - buspar 15mg    MDD - zoloft 75mg  - doing well no side effects, needs refill  Contraception - pt would like to start birth control  Review of Systems  Constitutional:  Negative for activity change, fatigue and fever.  Respiratory:  Negative for cough and shortness of breath.   Cardiovascular:  Negative for chest pain.  Gastrointestinal:  Negative for abdominal pain.  Genitourinary:  Negative for difficulty urinating.       Current Meds  Medication Sig   albuterol (VENTOLIN HFA) 108 (90 Base) MCG/ACT inhaler Inhale 2 puffs into the lungs every 6 (six) hours as needed for wheezing or shortness of breath.   busPIRone (BUSPAR) 15 MG tablet Take 1 tablet (15 mg total) by mouth 2 (two) times daily as needed (panic attack).   Cholecalciferol 20 MCG (800 UNIT) TABS Take 1 tablet by mouth daily.   Multiple Vitamins-Minerals (WOMENS MULTI VITAMIN & MINERAL) TABS Take 1 tablet by mouth daily.   norgestimate-ethinyl estradiol (SPRINTEC 28) 0.25-35 MG-MCG tablet Take 1 tablet by mouth daily.   tranexamic acid (LYSTEDA) 650 MG TABS tablet Take 2 tablets (1,300 mg total) by mouth 3 (three) times daily. Take during menses for a maximum of five days   [DISCONTINUED] sertraline (ZOLOFT) 25 MG tablet Take 1 tablet (25 mg total) by mouth daily.   [DISCONTINUED] sertraline (ZOLOFT) 50 MG tablet Take 1 tablet (50 mg total) by mouth daily.   [DISCONTINUED] valACYclovir (VALTREX) 1000 MG tablet Take 1 tablet (1,000 mg total) by mouth daily.    OBJECTIVE    BP (!) 118/52 (BP Location: Left Arm, Patient Position:  Sitting, Cuff Size: Normal)   Pulse 74   Resp 20   Ht 5\' 4"  (1.626 m)   Wt 190 lb 4 oz (86.3 kg)   LMP 03/16/2023   SpO2 99%   BMI 32.66 kg/m   Physical Exam Vitals and nursing note reviewed.  Constitutional:      General: She is not in acute distress.    Appearance: Normal appearance.  HENT:     Head: Normocephalic and atraumatic.     Right Ear: External ear normal.     Left Ear: External ear normal.     Nose: Nose normal.  Eyes:     Conjunctiva/sclera: Conjunctivae normal.  Cardiovascular:     Rate and Rhythm: Normal rate and regular rhythm.  Pulmonary:     Effort: Pulmonary effort is normal.     Breath sounds: Normal breath sounds.  Neurological:     General: No focal deficit present.     Mental Status: She is alert and oriented to person, place, and time.  Psychiatric:        Mood and Affect: Mood normal.        Behavior: Behavior normal.        Thought Content: Thought content normal.        Judgment: Judgment normal.        ASSESSMENT & PLAN    Problem List Items Addressed This Visit  Genitourinary   Herpes, genital    - refill on valtrex      Relevant Medications   valACYclovir (VALTREX) 1000 MG tablet     Other   Anxiety    Refill on zoloft      Relevant Medications   sertraline (ZOLOFT) 50 MG tablet   sertraline (ZOLOFT) 25 MG tablet   Current moderate episode of major depressive disorder without prior episode (HCC)    - refill on zoloft, no side effects tolerating well  - follow up 6 months      Relevant Medications   sertraline (ZOLOFT) 50 MG tablet   sertraline (ZOLOFT) 25 MG tablet   Other Visit Diagnoses     Encounter for initial prescription of contraceptive pills    -  Primary   Relevant Medications   norgestimate-ethinyl estradiol (SPRINTEC 28) 0.25-35 MG-MCG tablet       Return in about 6 months (around 10/07/2023).      Meds ordered this encounter  Medications   sertraline (ZOLOFT) 50 MG tablet    Sig: Take 1  tablet (50 mg total) by mouth daily.    Dispense:  90 tablet    Refill:  1   sertraline (ZOLOFT) 25 MG tablet    Sig: Take 1 tablet (25 mg total) by mouth daily.    Dispense:  90 tablet    Refill:  1   valACYclovir (VALTREX) 1000 MG tablet    Sig: Take 1 tablet (1,000 mg total) by mouth daily.    Dispense:  90 tablet    Refill:  1   norgestimate-ethinyl estradiol (SPRINTEC 28) 0.25-35 MG-MCG tablet    Sig: Take 1 tablet by mouth daily.    Dispense:  28 tablet    Refill:  11    No orders of the defined types were placed in this encounter.    Charlton Amor, DO  [facility] & [facility] Medicine at [facility] [phone] (phone) [phone] (fax)  [facility]

## 2023-04-07 NOTE — Assessment & Plan Note (Signed)
-   refill on zoloft, no side effects tolerating well  - follow up 6 months

## 2023-04-07 NOTE — Assessment & Plan Note (Signed)
-   refill on valtrex

## 2023-04-07 NOTE — Assessment & Plan Note (Signed)
Refill on zoloft  

## 2023-04-24 ENCOUNTER — Encounter: Payer: Self-pay | Admitting: Family

## 2023-05-05 ENCOUNTER — Ambulatory Visit: Payer: Commercial Managed Care - HMO | Admitting: Family Medicine

## 2023-05-05 VITALS — BP 130/69 | HR 91 | Resp 20 | Ht 64.0 in | Wt 190.2 lb

## 2023-05-05 DIAGNOSIS — Z113 Encounter for screening for infections with a predominantly sexual mode of transmission: Secondary | ICD-10-CM | POA: Diagnosis not present

## 2023-05-05 NOTE — Assessment & Plan Note (Signed)
-   pt presents for std testing. Has noticed some yellowish discharge with a foul odor. Did take a pregnancy test this am and it was negative but LMP was July 2-6

## 2023-05-05 NOTE — Progress Notes (Unsigned)
   Acute Office Visit  Subjective:     Patient ID: Rebekah Carter, female    DOB: [birth year], 27 y.o.   MRN: [id number]  No chief complaint on file.   HPI Patient is in today for STD testing. Notes yellowish discharge. Is sexually active. Lmp in July   Review of Systems  Constitutional:  Negative for chills and fever.  Respiratory:  Negative for cough and shortness of breath.   Cardiovascular:  Negative for chest pain.  Neurological:  Negative for headaches.        Objective:    LMP 03/16/2023    Physical Exam Vitals and nursing note reviewed.  Constitutional:      General: She is not in acute distress.    Appearance: Normal appearance.  HENT:     Head: Normocephalic and atraumatic.     Right Ear: External ear normal.     Left Ear: External ear normal.     Nose: Nose normal.  Eyes:     Conjunctiva/sclera: Conjunctivae normal.  Cardiovascular:     Rate and Rhythm: Normal rate and regular rhythm.  Pulmonary:     Effort: Pulmonary effort is normal.     Breath sounds: Normal breath sounds.  Neurological:     General: No focal deficit present.     Mental Status: She is alert and oriented to person, place, and time.  Psychiatric:        Mood and Affect: Mood normal.        Behavior: Behavior normal.        Thought Content: Thought content normal.        Judgment: Judgment normal.     No results found for any visits on 05/05/23.      Assessment & Plan:   Problem List Items Addressed This Visit       Other   Screening examination for STD (sexually transmitted disease) - Primary    - pt presents for std testing. Has noticed some yellowish discharge with a foul odor. Did take a pregnancy test this am and it was negative but LMP was July 2-6      Relevant Orders   Trichomonas vaginalis, RNA   HIV Antibody (routine testing w rflx)   RPR   GC/Chlamydia Probe Amp   WET PREP FOR TRICH, YEAST, CLUE    No orders of the defined types were placed in this  encounter.   No follow-ups on file.  Charlton Amor, DO

## 2023-05-08 ENCOUNTER — Encounter: Payer: Self-pay | Admitting: Family Medicine

## 2023-05-18 ENCOUNTER — Ambulatory Visit: Payer: Commercial Managed Care - HMO | Admitting: Family Medicine

## 2023-05-22 NOTE — Progress Notes (Deleted)
Psychiatric Initial Adult Assessment  Patient Identification: Rebekah Carter MRN:  [id number] Date of Evaluation:  05/22/2023 Referral Source: Charlton Amor, DO   Assessment:  Rebekah Carter is a 27 y.o. female with a history of MDD, iron deficiency anemia 2/2 to heavy menses who presents in person to [facility] for initial evaluation of anxiety of panic attacks. Patient's psychotropic medication regiment by PCP was sertraline 75 mg daily for depression/anxiety and buspirone 15 mg bid for anxiety.  Patient reports ***  RLS? gabapentin  Plan:  # *** Past medication trials:  Status of problem: *** Interventions: -- ***  # *** Past medication trials:  Status of problem: *** Interventions: -- ***  # *** Past medication trials:  Status of problem: *** Interventions: -- ***  Return to care in ***  Patient was given contact information for behavioral health clinic and was instructed to call 911 for emergencies.    Patient and plan of care will be discussed with the Attending MD, Dr. ***, who agrees with the above statement and plan.   Subjective:  Chief Complaint: Medication Management  History of Present Illness:  ***  Past Psychiatric History:  Diagnoses: *** Medication trials: *** Previous psychiatrist/therapist: *** Hospitalizations: *** Suicide attempts: *** SIB: *** Hx of violence towards others: *** Current access to guns: *** Hx of trauma/abuse: ***  Substance Abuse History in the last 12 months:  {yes no:314532}  Past Medical History:  Past Medical History:  Diagnosis Date   Anemia    Anxiety    Asthma    as a child    Refusal of blood transfusions as patient is Jehovah's Witness 12/17/2019   Seizures (HCC)    last seizure 8 years ago     Past Surgical History:  Procedure Laterality Date   LUMBAR LAMINECTOMY/DECOMPRESSION MICRODISCECTOMY Right 12/02/2015   Procedure: CENTRAL DECOMPRESSION L5 - S1 AND L4 - L5 FOR SPINAL  STENOSIS, MICRODISCECTOMY L4-L5, L5-S1, FORAMINOTOMIES L5 ADN S1 ROOT ON THE RIGHT;  Surgeon: Ranee Gosselin, MD;  Location: [facility];  Service: Orthopedics;  Laterality: Right;   TONSILLECTOMY AND ADENOIDECTOMY  2004    Family Psychiatric History: ***  Family History:  Family History  Problem Relation Age of Onset   Diabetes Maternal Grandfather    Hypertension Maternal Grandfather    Diabetes Paternal Grandfather    Hypertension Paternal Grandfather     Social History:   Academic/Vocational: *** Social History   Socioeconomic History   Marital status: Single    Spouse name: Not on file   Number of children: Not on file   Years of education: Not on file   Highest education level: High school graduate  Occupational History   Not on file  Tobacco Use   Smoking status: Never   Smokeless tobacco: Never  Vaping Use   Vaping status: Some Days  Substance and Sexual Activity   Alcohol use: No   Drug use: No   Sexual activity: Yes    Partners: Male    Birth control/protection: Condom  Other Topics Concern   Not on file  Social History Narrative   Works at the [employer] in [place] 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      Social Determinants of Health   Financial Resource Strain: Not on file  Food Insecurity: Not on file  Transportation Needs: Not on file  Physical Activity: Not on file  Stress: Not on file  Social Connections: Not on file    Additional Social History: updated  Allergies:   Allergies  Allergen Reactions   Amoxicillin Rash and Other (See Comments)    REACTION: seizures   Latex Rash   Other Other (See Comments)    Patient is a Jehovah's Witness    Peanut-Containing Drug Products Itching, Swelling and Rash    Current Medications: Current Outpatient Medications  Medication Sig Dispense Refill   albuterol (VENTOLIN HFA) 108 (90 Base) MCG/ACT inhaler Inhale 2 puffs into the lungs  every 6 (six) hours as needed for wheezing or shortness of breath. 8 g 0   busPIRone (BUSPAR) 15 MG tablet Take 1 tablet (15 mg total) by mouth 2 (two) times daily as needed (panic attack). 30 tablet 0   Cholecalciferol 20 MCG (800 UNIT) TABS Take 1 tablet by mouth daily. 75 tablet 0   Multiple Vitamins-Minerals (WOMENS MULTI VITAMIN & MINERAL) TABS Take 1 tablet by mouth daily.     norgestimate-ethinyl estradiol (SPRINTEC 28) 0.25-35 MG-MCG tablet Take 1 tablet by mouth daily. 28 tablet 11   sertraline (ZOLOFT) 25 MG tablet Take 1 tablet (25 mg total) by mouth daily. 90 tablet 1   sertraline (ZOLOFT) 50 MG tablet Take 1 tablet (50 mg total) by mouth daily. 90 tablet 1   tranexamic acid (LYSTEDA) 650 MG TABS tablet Take 2 tablets (1,300 mg total) by mouth 3 (three) times daily. Take during menses for a maximum of five days 30 tablet 2   valACYclovir (VALTREX) 1000 MG tablet Take 1 tablet (1,000 mg total) by mouth daily. 90 tablet 1   No current facility-administered medications for this visit.    ROS: Review of Systems ***  Objective:  Psychiatric Specialty Exam: There were no vitals taken for this visit.There is no height or weight on file to calculate BMI.  General Appearance: {Appearance:22683}  Eye Contact:  {BHH EYE CONTACT:22684}  Speech:  {Speech:22685}  Volume:  {Volume (PAA):22686}  Mood:  {BHH MOOD:22306}  Affect:  {Affect (PAA):22687}  Thought Content: {Thought Content:22690}   Suicidal Thoughts:  {ST/HT (PAA):22692}  Homicidal Thoughts:  {ST/HT (PAA):22692}  Thought Process:  {Thought Process (PAA):22688}  Orientation:  {BHH ORIENTATION (PAA):22689}    Memory: {BHH MEMORY:22881}  Judgment:  {Judgement (PAA):22694}  Insight:  {Insight (PAA):22695}  Concentration:  {Concentration:21399}  Recall:  not formally assessed ***  Fund of Knowledge: {BHH GOOD/FAIR/POOR:22877}  Language: {BHH GOOD/FAIR/POOR:22877}  Psychomotor Activity:  {Psychomotor (PAA):22696}  Akathisia:   {BHH YES OR NO:22294}  AIMS (if indicated): {Desc; done/not:10129}  Assets:  {Assets (PAA):22698}  ADL's:  {BHH FAO'Z:30865}  Cognition: {chl bhh cognition:304700322}  Sleep:  {BHH GOOD/FAIR/POOR:22877}   PE: General: well-appearing; no acute distress *** Pulm: no increased work of breathing on room air *** Strength & Muscle Tone: {desc; muscle tone:32375} Neuro: no focal neurological deficits observed *** Gait & Station: {PE GAIT ED HQIO:96295}  Metabolic Disorder Labs: Lab Results  Component Value Date   HGBA1C 5.4 05/05/2022   No results found for: "PROLACTIN" Lab Results  Component Value Date   CHOL 137 12/17/2019   TRIG 81.0 12/17/2019   HDL 37.10 (L) 12/17/2019   CHOLHDL 4 12/17/2019   VLDL 16.2 12/17/2019   LDLCALC 83 12/17/2019   LDLCALC 80 03/06/2015   Lab Results  Component Value Date   TSH 2.70 11/23/2021    Therapeutic Level Labs: No results found for: "LITHIUM" No results found for: "CBMZ" No results found for: "VALPROATE"  Screenings:  GAD-7    Flowsheet Row Office Visit from 02/15/2023 in [facility] Primary Care & Sports Medicine at [facility] Office Visit from 09/16/2022 in [facility] Primary Care & Sports Medicine at [facility]  Total GAD-7 Score 5 4      PHQ2-9    Flowsheet Row Office Visit from 04/07/2023 in [facility] Primary Care & Sports Medicine at [facility] Office Visit from 02/15/2023 in [facility] Primary Care & Sports Medicine at [facility] Office Visit from 11/23/2022 in [facility] Primary Care & Sports Medicine at [facility] Office Visit from 09/16/2022 in [facility] Primary Care & Sports Medicine at [facility] Office Visit from 02/22/2021 in [facility] Primary Care at [facility]  PHQ-2 Total Score 0 0 0 2 2  PHQ-9 Total Score -- 5 -- 4 --       Collaboration of Care: Collaboration of Care: [facility] OP Collaboration of  Care:[id number]}  Patient/Guardian was advised Release of Information must be obtained prior to any record release in order to collaborate their care with an outside provider. Patient/Guardian was advised if they have not already done so to contact the registration department to sign all necessary forms in order for [place] to release information regarding their care.   Consent: Patient/Guardian gives verbal consent for treatment and assignment of benefits for services provided during this visit. Patient/Guardian expressed understanding and agreed to proceed.   Park Pope, MD 8/12/202410:19 AM

## 2023-05-23 ENCOUNTER — Ambulatory Visit (INDEPENDENT_AMBULATORY_CARE_PROVIDER_SITE_OTHER): Payer: Self-pay | Admitting: Family Medicine

## 2023-05-23 DIAGNOSIS — Z91199 Patient's noncompliance with other medical treatment and regimen due to unspecified reason: Secondary | ICD-10-CM

## 2023-05-23 NOTE — Progress Notes (Signed)
No show

## 2023-05-24 ENCOUNTER — Ambulatory Visit (HOSPITAL_COMMUNITY): Payer: Commercial Managed Care - HMO | Admitting: Student

## 2023-09-12 ENCOUNTER — Ambulatory Visit: Payer: Commercial Managed Care - HMO | Admitting: Family Medicine

## 2023-09-20 ENCOUNTER — Ambulatory Visit: Payer: Commercial Managed Care - HMO | Admitting: Family Medicine

## 2023-10-09 ENCOUNTER — Ambulatory Visit: Payer: Commercial Managed Care - HMO | Admitting: Family Medicine

## 2023-10-13 ENCOUNTER — Encounter: Payer: Self-pay | Admitting: Family

## 2023-10-19 ENCOUNTER — Encounter: Payer: Self-pay | Admitting: Family Medicine

## 2023-10-20 ENCOUNTER — Encounter: Payer: Self-pay | Admitting: Family Medicine

## 2023-10-20 ENCOUNTER — Ambulatory Visit (INDEPENDENT_AMBULATORY_CARE_PROVIDER_SITE_OTHER): Payer: 59 | Admitting: Family Medicine

## 2023-10-20 ENCOUNTER — Encounter: Payer: Self-pay | Admitting: Family

## 2023-10-20 VITALS — BP 142/76 | HR 75 | Ht 64.0 in | Wt 197.2 lb

## 2023-10-20 DIAGNOSIS — J3089 Other allergic rhinitis: Secondary | ICD-10-CM | POA: Diagnosis not present

## 2023-10-20 DIAGNOSIS — F419 Anxiety disorder, unspecified: Secondary | ICD-10-CM

## 2023-10-20 DIAGNOSIS — A6000 Herpesviral infection of urogenital system, unspecified: Secondary | ICD-10-CM

## 2023-10-20 DIAGNOSIS — F321 Major depressive disorder, single episode, moderate: Secondary | ICD-10-CM

## 2023-10-20 DIAGNOSIS — Z113 Encounter for screening for infections with a predominantly sexual mode of transmission: Secondary | ICD-10-CM | POA: Diagnosis not present

## 2023-10-20 MED ORDER — VALACYCLOVIR HCL 1 G PO TABS: 1000.0000 mg | ORAL_TABLET | Freq: Every day | ORAL | 1 refills | Status: DC

## 2023-10-20 MED ORDER — LEVOCETIRIZINE DIHYDROCHLORIDE 5 MG PO TABS: 5.0000 mg | ORAL_TABLET | Freq: Every evening | ORAL | 3 refills | Status: DC

## 2023-10-20 MED ORDER — SERTRALINE HCL 25 MG PO TABS: 25.0000 mg | ORAL_TABLET | Freq: Every day | ORAL | 1 refills | Status: DC

## 2023-10-20 MED ORDER — SERTRALINE HCL 50 MG PO TABS: 50.0000 mg | ORAL_TABLET | Freq: Every day | ORAL | 1 refills | Status: DC

## 2023-10-20 MED ORDER — AZELASTINE HCL 0.1 % NA SOLN: 2.0000 | Freq: Two times a day (BID) | NASAL | 1 refills | Status: AC

## 2023-10-20 NOTE — Progress Notes (Signed)
 Established patient visit   Patient: Rebekah Carter   DOB: Mar 07, 1996   28 y.o. Female  MRN: [id number] Visit Date: 10/20/2023  Today's healthcare provider: Bernice GORMAN Juneau, DO   Chief Complaint  Patient presents with   std screening     Pt would like to be tested for STDs    SUBJECTIVE    Chief Complaint  Patient presents with   std screening     Pt would like to be tested for STDs   HPI HPI     std screening     Additional comments: Pt would like to be tested for STDs      Last edited by Duwaine Riggs, CMA on 10/20/2023  9:52 AM.       Pt presents with concerns for STD testing.   Also following up on zoloft  and is doing well for this. She is on 75mg  and has had no side effects.   Would also like valtrex  refill.  Review of Systems  Constitutional:  Negative for activity change, fatigue and fever.  Respiratory:  Negative for cough and shortness of breath.   Cardiovascular:  Negative for chest pain.  Gastrointestinal:  Negative for abdominal pain.  Genitourinary:  Negative for difficulty urinating.       Current Meds  Medication Sig   albuterol  (VENTOLIN  HFA) 108 (90 Base) MCG/ACT inhaler Inhale 2 puffs into the lungs every 6 (six) hours as needed for wheezing or shortness of breath.   azelastine  (ASTELIN ) 0.1 % nasal spray Place 2 sprays into both nostrils 2 (two) times daily. Use in each nostril as directed   busPIRone  (BUSPAR ) 15 MG tablet Take 1 tablet (15 mg total) by mouth 2 (two) times daily as needed (panic attack).   Cholecalciferol  20 MCG (800 UNIT) TABS Take 1 tablet by mouth daily.   levocetirizine (XYZAL ) 5 MG tablet Take 1 tablet (5 mg total) by mouth every evening.   Multiple Vitamins-Minerals (WOMENS MULTI VITAMIN & MINERAL) TABS Take 1 tablet by mouth daily.   norgestimate -ethinyl estradiol  (SPRINTEC 28) 0.25-35 MG-MCG tablet Take 1 tablet by mouth daily.   tranexamic acid  (LYSTEDA ) 650 MG TABS tablet Take 2 tablets (1,300 mg total) by  mouth 3 (three) times daily. Take during menses for a maximum of five days   [DISCONTINUED] sertraline  (ZOLOFT ) 25 MG tablet Take 1 tablet (25 mg total) by mouth daily.   [DISCONTINUED] sertraline  (ZOLOFT ) 50 MG tablet Take 1 tablet (50 mg total) by mouth daily.   [DISCONTINUED] valACYclovir  (VALTREX ) 1000 MG tablet Take 1 tablet (1,000 mg total) by mouth daily.    OBJECTIVE    BP (!) 142/76 (BP Location: Left Arm, Patient Position: Sitting)   Pulse 75   Ht 5' 4 (1.626 m)   Wt 197 lb 4 oz (89.5 kg)   SpO2 98%   BMI 33.86 kg/m   Physical Exam Vitals and nursing note reviewed.  Constitutional:      General: She is not in acute distress.    Appearance: Normal appearance.  HENT:     Head: Normocephalic and atraumatic.     Right Ear: External ear normal.     Left Ear: External ear normal.     Nose: Nose normal.  Eyes:     Conjunctiva/sclera: Conjunctivae normal.  Cardiovascular:     Rate and Rhythm: Normal rate and regular rhythm.  Pulmonary:     Effort: Pulmonary effort is normal.     Breath sounds: Normal breath sounds.  Neurological:     General: No focal deficit present.     Mental Status: She is alert and oriented to person, place, and time.  Psychiatric:        Mood and Affect: Mood normal.        Behavior: Behavior normal.        Thought Content: Thought content normal.        Judgment: Judgment normal.        ASSESSMENT & PLAN    Problem List Items Addressed This Visit       Respiratory   Non-seasonal allergic rhinitis   Pt notes some running of eyes and nose. She has a hx of seasonal allergies. Recommended we try azelastin spray and xyzal . Have also sent referral to allergy for repeat testing and possible immunotherapy injections      Relevant Medications   levocetirizine (XYZAL ) 5 MG tablet   Other Relevant Orders   Ambulatory referral to Allergy     Genitourinary   Herpes, genital   Recommended only taking valtrex  during flare up. Sent in refills  per pt request      Relevant Medications   valACYclovir  (VALTREX ) 1000 MG tablet     Other   Anxiety   Relevant Medications   sertraline  (ZOLOFT ) 50 MG tablet   sertraline  (ZOLOFT ) 25 MG tablet   Current moderate episode of major depressive disorder without prior episode (HCC)   Refill zoloft  75mg  as she is tolerating this well.       Relevant Medications   sertraline  (ZOLOFT ) 50 MG tablet   sertraline  (ZOLOFT ) 25 MG tablet   Screening examination for STD (sexually transmitted disease) - Primary   Ordered GC Trich on urine, HIV, and RPR testing. Pt says she has a new partner. Has not been having any new symptoms.      Relevant Orders   WET PREP FOR TRICH, YEAST, CLUE   GC/Chlamydia Probe Amp([facility])   HIV antibody (with reflex)   RPR   Chlamydia/Gonococcus/Trichomonas, NAA    No follow-ups on file.      Meds ordered this encounter  Medications   azelastine  (ASTELIN ) 0.1 % nasal spray    Sig: Place 2 sprays into both nostrils 2 (two) times daily. Use in each nostril as directed    Dispense:  301 mL    Refill:  1    Use generic Astelin    sertraline  (ZOLOFT ) 50 MG tablet    Sig: Take 1 tablet (50 mg total) by mouth daily.    Dispense:  90 tablet    Refill:  1   sertraline  (ZOLOFT ) 25 MG tablet    Sig: Take 1 tablet (25 mg total) by mouth daily.    Dispense:  90 tablet    Refill:  1   valACYclovir  (VALTREX ) 1000 MG tablet    Sig: Take 1 tablet (1,000 mg total) by mouth daily.    Dispense:  90 tablet    Refill:  1   levocetirizine (XYZAL ) 5 MG tablet    Sig: Take 1 tablet (5 mg total) by mouth every evening.    Dispense:  30 tablet    Refill:  3    Orders Placed This Encounter  Procedures   WET PREP FOR TRICH, YEAST, CLUE   GC/Chlamydia Probe Amp([facility])   Chlamydia/Gonococcus/Trichomonas, NAA   HIV antibody (with reflex)   RPR   Ambulatory referral to Allergy    Referral Priority:   Routine    Referral Type:   Allergy  Testing    Referral Reason:    Specialty Services Required    Requested Specialty:   Allergy    Number of Visits Requested:   1     Bernice GORMAN Juneau, DO  [facility] Primary Care & Sports Medicine at [facility] [phone] (phone) [phone] (fax)  [facility]

## 2023-10-20 NOTE — Assessment & Plan Note (Signed)
 Ordered GC Trich on urine, HIV, and RPR testing. Pt says she has a new partner. Has not been having any new symptoms.

## 2023-10-20 NOTE — Assessment & Plan Note (Signed)
 Refill zoloft 75mg  as she is tolerating this well.

## 2023-10-20 NOTE — Assessment & Plan Note (Signed)
 Recommended only taking valtrex during flare up. Sent in refills per pt request

## 2023-10-20 NOTE — Assessment & Plan Note (Signed)
 Pt notes some running of eyes and nose. She has a hx of seasonal allergies. Recommended we try azelastin spray and xyzal. Have also sent referral to allergy for repeat testing and possible immunotherapy injections

## 2023-10-23 LAB — RPR: RPR Ser Ql: NONREACTIVE

## 2023-10-23 LAB — HIV ANTIBODY (ROUTINE TESTING W REFLEX)

## 2023-10-24 LAB — GC/CHLAMYDIA PROBE AMP
Chlamydia trachomatis, NAA: NEGATIVE
Neisseria Gonorrhoeae by PCR: NEGATIVE

## 2023-10-25 ENCOUNTER — Other Ambulatory Visit: Payer: Self-pay | Admitting: Family Medicine

## 2023-10-25 ENCOUNTER — Telehealth: Payer: Self-pay | Admitting: Family Medicine

## 2023-10-25 ENCOUNTER — Encounter: Payer: Self-pay | Admitting: Family Medicine

## 2023-10-25 LAB — WET PREP FOR TRICH, YEAST, CLUE
Clue Cell Exam: POSITIVE — AB
Trichomonas Exam: NEGATIVE
Yeast Exam: NEGATIVE

## 2023-10-25 MED ORDER — METRONIDAZOLE 500 MG PO TABS: 500.0000 mg | ORAL_TABLET | Freq: Two times a day (BID) | ORAL | 0 refills | Status: DC

## 2023-10-25 NOTE — Telephone Encounter (Signed)
Copied from CRM [id number]. Topic: Clinical - Prescription Issue >> Oct 25, 2023  4:10 PM Alcus Dad H wrote: Reason for CRM: Patient is needing medication metroNIDAZOLE (FLAGYL) 500 MG resent to different pharmacy because the [facility] pharmacy does not have medication. [facility] on [address]. That is going to be patient's primary pharmacy now.

## 2023-10-26 MED ORDER — METRONIDAZOLE 500 MG PO TABS: 500.0000 mg | ORAL_TABLET | Freq: Two times a day (BID) | ORAL | 0 refills | Status: DC

## 2023-10-26 NOTE — Addendum Note (Signed)
Addended by: Elizabeth Palau on: 10/26/2023 02:00 PM   Modules accepted: Orders

## 2023-10-26 NOTE — Telephone Encounter (Signed)
[facility] [address] [place] and [facility] [place].

## 2023-10-30 ENCOUNTER — Other Ambulatory Visit: Payer: Self-pay | Admitting: Family Medicine

## 2023-10-31 ENCOUNTER — Other Ambulatory Visit: Payer: Self-pay

## 2023-10-31 MED ORDER — METRONIDAZOLE 500 MG PO TABS: 500.0000 mg | ORAL_TABLET | Freq: Two times a day (BID) | ORAL | 0 refills | Status: AC

## 2023-11-28 ENCOUNTER — Telehealth: Payer: Self-pay | Admitting: Family Medicine

## 2023-11-28 NOTE — Telephone Encounter (Signed)
Copied from CRM [id number]. Topic: Complaint (DO NOT CONVERT) - Staff >> Nov 28, 2023  1:12 PM Fonda Kinder J wrote: Date of Incident: 11/27/2023 Details of complaint: Pt states that the nurse that seen her parents Rogeli Enlow, and [employer] on yesterday who works with [employer] was extremely rude and disrespectful to [employer] when she inquired about blood pressure medication samples for Rogeli. She states the nurse even made her mother cry and she only wanted samples. She is requesting to speak to the office admin   Callback # [phone] Route to Practice Administrator.

## 2024-01-17 ENCOUNTER — Telehealth: Payer: Self-pay | Admitting: *Deleted

## 2024-01-17 NOTE — Telephone Encounter (Signed)
 Returned call from 1:45 PM. Left patient a message to call and schedule.

## 2024-02-01 ENCOUNTER — Ambulatory Visit (INDEPENDENT_AMBULATORY_CARE_PROVIDER_SITE_OTHER): Admitting: Family Medicine

## 2024-02-01 ENCOUNTER — Encounter: Payer: Self-pay | Admitting: Family Medicine

## 2024-02-01 VITALS — BP 127/57 | HR 80 | Ht 64.0 in | Wt 199.8 lb

## 2024-02-01 DIAGNOSIS — N3000 Acute cystitis without hematuria: Secondary | ICD-10-CM | POA: Diagnosis not present

## 2024-02-01 DIAGNOSIS — B9689 Other specified bacterial agents as the cause of diseases classified elsewhere: Secondary | ICD-10-CM

## 2024-02-01 DIAGNOSIS — Z113 Encounter for screening for infections with a predominantly sexual mode of transmission: Secondary | ICD-10-CM | POA: Diagnosis not present

## 2024-02-01 DIAGNOSIS — R300 Dysuria: Secondary | ICD-10-CM | POA: Diagnosis not present

## 2024-02-01 DIAGNOSIS — N760 Acute vaginitis: Secondary | ICD-10-CM

## 2024-02-01 LAB — POCT URINALYSIS DIP (CLINITEK)
Bilirubin, UA: NEGATIVE
Glucose, UA: NEGATIVE mg/dL
Nitrite, UA: POSITIVE — AB
POC PROTEIN,UA: 100 — AB
Spec Grav, UA: >=1.03 — AB (ref 1.010–1.025)
Urobilinogen, UA: 0.2 U/dL
pH, UA: 7 (ref 5.0–8.0)

## 2024-02-01 MED ORDER — NITROFURANTOIN MONOHYD MACRO 100 MG PO CAPS: 100.0000 mg | ORAL_CAPSULE | Freq: Two times a day (BID) | ORAL | 0 refills | Status: DC

## 2024-02-01 NOTE — Progress Notes (Addendum)
 Acute Office Visit  Subjective:     Patient ID: Rebekah Carter, female    DOB: 10-Mar-1996, 28 y.o.   MRN: [id number]  Chief Complaint  Patient presents with   std testing    HPI Patient is in today for some pelvic pressure dysuria and hematuria that started yesterday.  Last time she had a UTI was in 2017 after her back surgery.  Has been more sexually active with her boyfriend. Would like to have STD testing.    She says her routine yearly appointment is coming up soon.  ROS      Objective:    BP (!) 127/57 (BP Location: Left Arm, Patient Position: Sitting, Cuff Size: Normal)   Pulse 80   Ht 5\' 4"  (1.626 m)   Wt 199 lb 12 oz (90.6 kg)   SpO2 100%   BMI 34.29 kg/m    Physical Exam Vitals reviewed.  Constitutional:      Appearance: Normal appearance.  HENT:     Head: Normocephalic.  Pulmonary:     Effort: Pulmonary effort is normal.  Neurological:     Mental Status: She is alert and oriented to person, place, and time.  Psychiatric:        Mood and Affect: Mood normal.        Behavior: Behavior normal.     Results for orders placed or performed in visit on 02/01/24  Urine Culture   Specimen: Urine   Urine  Result Value Ref Range   Urine Culture, Routine Final report (A)    Organism ID, Bacteria Escherichia coli (A)    Antimicrobial Susceptibility Comment   NuSwab Vaginitis Plus (VG+)  Result Value Ref Range   Atopobium vaginae High - 2 (A) Score   BVAB 2 High - 2 (A) Score   Megasphaera 1 High - 2 (A) Score   Candida albicans, NAA Negative Negative   Candida glabrata, NAA Negative Negative   Trich vag by NAA Negative Negative   Chlamydia trachomatis, NAA Negative Negative   Neisseria gonorrhoeae, NAA Negative Negative  HIV antibody (with reflex)  Result Value Ref Range   HIV Screen 4th Generation wRfx Non Reactive Non Reactive  Hepatitis C Antibody  Result Value Ref Range   Hep C Virus Ab Non Reactive Non Reactive  POCT URINALYSIS DIP  (CLINITEK)  Result Value Ref Range   Color, UA yellow yellow   Clarity, UA clear clear   Glucose, UA negative negative mg/dL   Bilirubin, UA negative negative   Ketones, POC UA trace (5) (A) negative mg/dL   Spec Grav, UA >=4.098 (A) 1.010 - 1.025   Blood, UA moderate (A) negative   pH, UA 7.0 5.0 - 8.0   POC PROTEIN,UA =100 (A) negative, trace   Urobilinogen, UA 0.2 0.2 or 1.0 E.U./dL   Nitrite, UA Positive (A) Negative   Leukocytes, UA Trace (A) Negative        Assessment & Plan:   Problem List Items Addressed This Visit       Other   Screening examination for STD (sexually transmitted disease)   Other Visit Diagnoses       Screening for STD (sexually transmitted disease)    -  Primary   Relevant Orders   NuSwab Vaginitis Plus (VG+) (Completed)   HIV antibody (with reflex) (Completed)   Hepatitis C Antibody (Completed)     Dysuria       Relevant Orders   POCT URINALYSIS DIP (CLINITEK) (Completed)  Urine Culture (Completed)     Acute cystitis without hematuria         BV (bacterial vaginosis)       Relevant Medications   nitrofurantoin , macrocrystal-monohydrate, (MACROBID ) 100 MG capsule   metroNIDAZOLE  (METROGEL ) 0.75 % vaginal gel       UTI - will tx with macrobid .  Call if not better in one week. Inc water intake.    Screening STD labs send.    Meds ordered this encounter  Medications   DISCONTD: nitrofurantoin , macrocrystal-monohydrate, (MACROBID ) 100 MG capsule    Sig: Take 1 capsule (100 mg total) by mouth 2 (two) times daily.    Dispense:  10 capsule    Refill:  0   nitrofurantoin , macrocrystal-monohydrate, (MACROBID ) 100 MG capsule    Sig: Take 1 capsule (100 mg total) by mouth 2 (two) times daily.    Dispense:  10 capsule    Refill:  0   metroNIDAZOLE  (METROGEL ) 0.75 % vaginal gel    Sig: Place 1 Applicatorful vaginally 2 (two) times daily. 7 days    Dispense:  70 g    Refill:  0    No follow-ups on file.  Duaine German, MD

## 2024-02-02 LAB — HEPATITIS C ANTIBODY: Hep C Virus Ab: NONREACTIVE

## 2024-02-02 LAB — HIV ANTIBODY (ROUTINE TESTING W REFLEX): HIV Screen 4th Generation wRfx: NONREACTIVE

## 2024-02-04 LAB — URINE CULTURE

## 2024-02-04 LAB — NUSWAB VAGINITIS PLUS (VG+)
Atopobium vaginae: HIGH {score} — AB
BVAB 2: HIGH {score} — AB
Candida albicans, NAA: NEGATIVE
Candida glabrata, NAA: NEGATIVE
Chlamydia trachomatis, NAA: NEGATIVE
Megasphaera 1: HIGH {score} — AB
Neisseria gonorrhoeae, NAA: NEGATIVE
Trich vag by NAA: NEGATIVE

## 2024-02-05 ENCOUNTER — Encounter: Payer: Self-pay | Admitting: Family Medicine

## 2024-02-05 MED ORDER — METRONIDAZOLE 0.75 % VA GEL
1.0000 | Freq: Two times a day (BID) | VAGINAL | 0 refills | Status: DC
Start: 2024-02-05 — End: 2024-02-06

## 2024-02-05 NOTE — Addendum Note (Signed)
 Addended by: Mairim Bade D on: 02/05/2024 04:51 PM   Modules accepted: Orders

## 2024-02-05 NOTE — Progress Notes (Signed)
 HI Rebekah Carter, the urine culture came back positive for a bacteria called E. coli.  Fortunately, the antibiotic that we put you on is perfect to clear this up.  The vaginal swab was positive for bacterial vaginosis.  No sign of yeast, gonorrhea or chlamydia which is great.  I am going to send over prescription for vaginal metronidazole  to clear up the BV.

## 2024-02-06 ENCOUNTER — Telehealth: Payer: Self-pay

## 2024-02-06 DIAGNOSIS — B9689 Other specified bacterial agents as the cause of diseases classified elsewhere: Secondary | ICD-10-CM

## 2024-02-06 MED ORDER — METRONIDAZOLE 0.75 % VA GEL: 1.0000 | Freq: Two times a day (BID) | VAGINAL | 0 refills | Status: DC

## 2024-02-06 MED ORDER — NITROFURANTOIN MONOHYD MACRO 100 MG PO CAPS: 100.0000 mg | ORAL_CAPSULE | Freq: Two times a day (BID) | ORAL | 0 refills | Status: DC

## 2024-02-06 NOTE — Telephone Encounter (Signed)
 Spoke to patient. [facility] not in network with her insurance.  Nitrofurantoin  and [facility]ronidazole  [facility]

## 2024-02-06 NOTE — Telephone Encounter (Signed)
 Copied from CRM [id number]. Topic: Clinical - Prescription Issue >> Feb 06, 2024  1:35 PM Corin V wrote: Patient asked that the script stay at [facility] since [facility]. Please ignore request to change pharmacies.

## 2024-02-06 NOTE — Telephone Encounter (Signed)
Pended medication for refill

## 2024-02-06 NOTE — Telephone Encounter (Signed)
 [facility]

## 2024-02-06 NOTE — Telephone Encounter (Signed)
 Copied from CRM [id number]. Topic: Clinical - Prescription Issue >> Feb 06, 2024  2:14 PM Tisa Forester wrote: Reason for CRM: the prescription metroNIDAZOLE  (METROGEL ) 0.75 % vaginal gel  is ready at [facility] pharmacy  [address]  [phone] the issue with pharmacy need to be   [facility]   [id number] [place] [place] [facility] [address] [place] [address] Phone: [phone] Fax: [phone] Hours: Not open 24 hours  Patient is currently out of the refill ask ,patient request to pick up today  due to issue >> Feb 06, 2024  2:25 PM Tisa Forester wrote: Please contact patient today to let know if she would be able to pick up the medication refill today  Patient contact number [phone]

## 2024-02-08 ENCOUNTER — Encounter: Payer: Self-pay | Admitting: Obstetrics and Gynecology

## 2024-02-08 ENCOUNTER — Other Ambulatory Visit (HOSPITAL_COMMUNITY)
Admission: RE | Admit: 2024-02-08 | Discharge: 2024-02-08 | Disposition: A | Discharge status: Home / Self Care | Source: Ambulatory Visit | Attending: Obstetrics and Gynecology | Admitting: Obstetrics and Gynecology

## 2024-02-08 ENCOUNTER — Ambulatory Visit: Admitting: Obstetrics and Gynecology

## 2024-02-08 VITALS — BP 121/75 | HR 85 | Ht 66.0 in | Wt 202.0 lb

## 2024-02-08 DIAGNOSIS — R87619 Unspecified abnormal cytological findings in specimens from cervix uteri: Secondary | ICD-10-CM | POA: Diagnosis not present

## 2024-02-08 DIAGNOSIS — Z01419 Encounter for gynecological examination (general) (routine) without abnormal findings: Secondary | ICD-10-CM

## 2024-02-08 NOTE — Progress Notes (Signed)
   ANNUAL EXAM Patient name: Rebekah Carter MRN [id number]  Date of birth: 06/08/1996 Chief Complaint:   Gynecologic Exam  History of Present Illness:   Rebekah Carter is a 28 y.o. G0P0000 female being seen today for a routine annual exam.   Current concerns: TTC since August. Partner has a child from another relationship. She had normal testing last year.   Current birth control: None - TTC.   Patient's last menstrual period was 01/17/2024 (exact date).  Last Pap/Pap History:  2021: Normal pap 01/2021: Normal pap 11/2022: ASCUS/HPV pos   Health Maintenance Due  Topic Date Due   Pneumococcal Vaccine 44-66 Years old (1 of 2 - PCV) Never done   COVID-19 Vaccine (4 - 2024-25 season) 06/11/2023   Cervical Cancer Screening (Pap smear)  11/24/2023    Review of Systems:   Pertinent items are noted in HPI Denies any headaches, blurred vision, fatigue, shortness of breath, chest pain, abdominal pain, abnormal vaginal discharge/itching/odor/irritation, problems with periods, bowel movements, urination, or intercourse unless otherwise stated above.  Pertinent History Reviewed:  Reviewed past medical,surgical, social and family history.  Reviewed problem list, medications and allergies. Physical Assessment:   Vitals:   02/08/24 1617  BP: 121/75  Pulse: 85  Weight: 202 lb (91.6 kg)  Height: 5\' 6"  (1.676 m)  Body mass index is 32.6 kg/m.   Physical Examination:  General appearance - well appearing, and in no distress Mental status - alert, oriented to person, place, and time Psych:  She has a normal mood and affect Skin - warm and dry, normal color, no suspicious lesions noted Chest - effort normal Heart - normal rate  Breasts - breasts appear normal, no suspicious masses, no skin or nipple changes or axillary nodes Abdomen - soft, nontender, nondistended, no masses or organomegaly Pelvic -  VULVA: normal appearing vulva with no masses, tenderness or lesions  VAGINA:  normal appearing vagina with normal color and discharge, no lesions  CERVIX: normal appearing cervix without discharge or lesions, no CMT UTERUS: uterus is felt to be normal size, shape, consistency and nontender  ADNEXA: No adnexal masses or tenderness noted. Extremities:  No swelling or varicosities noted  Chaperone present for exam  No results found for this or any previous visit (from the past 24 hours).  Assessment & Plan:  Melani was seen today for gynecologic exam.  Diagnoses and all orders for this visit:  Encounter for annual routine gynecological examination - Cervical cancer screening: Discussed guidelines. Pap with HPV done for h/o abnormal pap - Gardasil: completed - GC/CT: declines - Birth Control:  TTC . Reviewed coming to see us  in August if no conception by that time.  - Breast Health: Encouraged self breast awareness/SBE. Teaching provided.  - F/U 12 months and prn -     Cytology - PAP  Abnormal cervical Papanicolaou smear, unspecified abnormal pap finding Cotesting today. Reviewed colposcopy if abnormal.      No orders of the defined types were placed in this encounter.   Meds: No orders of the defined types were placed in this encounter.   Follow-up: No follow-ups on file.  Lacey Pian, MD 02/08/2024 4:47 PM

## 2024-02-13 LAB — CYTOLOGY - PAP
Adequacy: ABSENT
Comment: NEGATIVE
Diagnosis: UNDETERMINED — AB
High risk HPV: NEGATIVE

## 2024-02-14 ENCOUNTER — Encounter: Payer: Self-pay | Admitting: Obstetrics and Gynecology

## 2024-02-15 ENCOUNTER — Encounter: Payer: Self-pay | Admitting: Family Medicine

## 2024-02-27 ENCOUNTER — Telehealth: Payer: Self-pay

## 2024-02-27 ENCOUNTER — Other Ambulatory Visit: Payer: Self-pay | Admitting: Family Medicine

## 2024-02-27 DIAGNOSIS — F419 Anxiety disorder, unspecified: Secondary | ICD-10-CM

## 2024-02-27 DIAGNOSIS — F321 Major depressive disorder, single episode, moderate: Secondary | ICD-10-CM

## 2024-02-27 DIAGNOSIS — A6000 Herpesviral infection of urogenital system, unspecified: Secondary | ICD-10-CM

## 2024-02-27 NOTE — Telephone Encounter (Signed)
 Copied from CRM [id number]. Topic: Appointments - Appointment Info/Confirmation >> Feb 27, 2024 12:00 PM Kevelyn M wrote: Patient called in because she had a physical scheduled with Leanne Pronto but she had an annual exam with the [facility] center already. These are different codes. Would the Annual Exam be able to take the place of a Physical? Please advise the patient [phone]

## 2024-02-27 NOTE — Telephone Encounter (Signed)
 Copied from CRM [id number]. Topic: Clinical - Medication Refill >> Feb 27, 2024 12:09 PM Kevelyn M wrote: Medication: sertraline  (ZOLOFT ) 25 MG tablet  sertraline  (ZOLOFT ) 50 MG tablet  valACYclovir  (VALTREX ) 1000 MG tablet   Has the patient contacted their pharmacy? No (Agent: If no, request that the patient contact the pharmacy for the refill. If patient does not wish to contact the pharmacy document the reason why and proceed with request.) (Agent: If yes, when and what did the pharmacy advise?) Patient is out of refills  This is the patient's preferred pharmacy:  [id number] - [place] [address] [place] [address] Phone: [phone] Fax: [phone]  Is this the correct pharmacy for this prescription?  If no, delete pharmacy and type the correct one. Yes  Has the prescription been filled recently? No  Is the patient out of the medication? Yes  Has the patient been seen for an appointment in the last year OR does the patient have an upcoming appointment? Yes  Can we respond through MyChart? Yes  Agent: Please be advised that Rx refills may take up to 3 business days. We ask that you follow-up with your pharmacy.

## 2024-02-27 NOTE — Telephone Encounter (Signed)
 Attempted call to patient. Left a voice mail message requesting a return call.

## 2024-02-27 NOTE — Telephone Encounter (Signed)
 Forwarding to [employer] covering DR. Dianah Fort

## 2024-02-27 NOTE — Telephone Encounter (Signed)
 As long as she gets labs and gyn that will be fine.

## 2024-03-01 ENCOUNTER — Other Ambulatory Visit: Payer: Self-pay

## 2024-03-01 DIAGNOSIS — F419 Anxiety disorder, unspecified: Secondary | ICD-10-CM

## 2024-03-01 DIAGNOSIS — F321 Major depressive disorder, single episode, moderate: Secondary | ICD-10-CM

## 2024-03-01 DIAGNOSIS — A6000 Herpesviral infection of urogenital system, unspecified: Secondary | ICD-10-CM

## 2024-03-01 MED ORDER — SERTRALINE HCL 50 MG PO TABS: 50.0000 mg | ORAL_TABLET | Freq: Every day | ORAL | 1 refills | Status: DC

## 2024-03-01 MED ORDER — SERTRALINE HCL 25 MG PO TABS: 25.0000 mg | ORAL_TABLET | Freq: Every day | ORAL | 1 refills | Status: DC

## 2024-03-01 MED ORDER — VALACYCLOVIR HCL 1 G PO TABS
1000.0000 mg | ORAL_TABLET | Freq: Every day | ORAL | 1 refills | Status: DC
Start: 2024-03-01 — End: 2024-05-22

## 2024-03-01 NOTE — Telephone Encounter (Signed)
 Attempted call to patient. Left a voice mail message requesting a return call.

## 2024-03-01 NOTE — Telephone Encounter (Signed)
 Spoke with patient informed.

## 2024-03-01 NOTE — Telephone Encounter (Signed)
  Forwarding request to [facility] covering Dr. Dianah Fort Patient requesting rx rf of  Valtrex  1000mg   Last written 10/20/2023 Zoloft   25mg   Last written  10/20/2023 Zoloft  50mg   Last written 10/20/2023 Last OV 02/01/2024 dr. Greer Leak Upcoming appt = none

## 2024-03-01 NOTE — Telephone Encounter (Signed)
 Again attempted call to patient. Left a voice mail message requesting a return call.

## 2024-03-06 ENCOUNTER — Ambulatory Visit: Admitting: Family Medicine

## 2024-03-18 ENCOUNTER — Encounter: Admitting: Family Medicine

## 2024-05-08 ENCOUNTER — Encounter: Payer: Self-pay | Admitting: Obstetrics and Gynecology

## 2024-05-22 ENCOUNTER — Ambulatory Visit (INDEPENDENT_AMBULATORY_CARE_PROVIDER_SITE_OTHER): Admitting: Obstetrics and Gynecology

## 2024-05-22 ENCOUNTER — Encounter: Payer: Self-pay | Admitting: Family

## 2024-05-22 ENCOUNTER — Encounter: Payer: Self-pay | Admitting: Obstetrics and Gynecology

## 2024-05-22 VITALS — BP 107/69 | HR 68 | Ht 65.0 in | Wt 201.0 lb

## 2024-05-22 DIAGNOSIS — F419 Anxiety disorder, unspecified: Secondary | ICD-10-CM | POA: Diagnosis not present

## 2024-05-22 DIAGNOSIS — A6000 Herpesviral infection of urogenital system, unspecified: Secondary | ICD-10-CM | POA: Diagnosis not present

## 2024-05-22 DIAGNOSIS — Z3169 Encounter for other general counseling and advice on procreation: Secondary | ICD-10-CM

## 2024-05-22 DIAGNOSIS — F321 Major depressive disorder, single episode, moderate: Secondary | ICD-10-CM | POA: Diagnosis not present

## 2024-05-22 MED ORDER — VALACYCLOVIR HCL 1 G PO TABS: 1000.0000 mg | ORAL_TABLET | Freq: Every day | ORAL | 1 refills | Status: DC

## 2024-05-22 MED ORDER — SERTRALINE HCL 25 MG PO TABS: 25.0000 mg | ORAL_TABLET | Freq: Every day | ORAL | 1 refills | Status: DC

## 2024-05-22 MED ORDER — SERTRALINE HCL 50 MG PO TABS: 50.0000 mg | ORAL_TABLET | Freq: Every day | ORAL | 1 refills | Status: DC

## 2024-05-22 NOTE — Progress Notes (Signed)
 GYNECOLOGY OFFICE VISIT NOTE  History:  Rebekah Carter is a 28 y.o. G0P0000 here today for infertility. She has been TTC x1 yr.      Past Medical History:  Diagnosis Date   Anemia    Anxiety    Asthma    as a child    Refusal of blood transfusions as patient is Jehovah's Witness 12/17/2019   Seizures (HCC)    last seizure 8 years ago     Past Surgical History:  Procedure Laterality Date   LUMBAR LAMINECTOMY/DECOMPRESSION MICRODISCECTOMY Right 12/02/2015   Procedure: CENTRAL DECOMPRESSION L5 - S1 AND L4 - L5 FOR SPINAL STENOSIS, MICRODISCECTOMY L4-L5, L5-S1, FORAMINOTOMIES L5 ADN S1 ROOT ON THE RIGHT;  Surgeon: Tanda Heading, MD;  Location: [facility];  Service: Orthopedics;  Laterality: Right;   TONSILLECTOMY AND ADENOIDECTOMY  2004    The following portions of the patient's history were reviewed and updated as appropriate: allergies, current medications, past family history, past medical history, past social history, past surgical history and problem list.   Health Maintenance:   ASCUS/HPV negative Diagnosis  Date Value Ref Range Status  02/08/2024 (A)  Final   - Atypical squamous cells of undetermined significance (ASC-US )    Review of Systems:  Pertinent items noted in HPI and remainder of comprehensive ROS otherwise negative.  Physical Exam:  BP 107/69   Pulse 68   Ht 5' 5 (1.651 m)   Wt 201 lb (91.2 kg)   LMP 05/02/2024 (Exact Date)   BMI 33.45 kg/m  CONSTITUTIONAL: Well-developed, well-nourished female in no acute distress.  HEENT:  Normocephalic, atraumatic. External right and left ear normal. No scleral icterus.  NECK: Normal range of motion, supple, no masses noted on observation SKIN: No rash noted. Not diaphoretic. No erythema. No pallor. MUSCULOSKELETAL: Normal range of motion. No edema noted. NEUROLOGIC: Alert and oriented to person, place, and time. Normal muscle tone coordination. No cranial nerve deficit noted. PSYCHIATRIC: Normal mood and affect.  Normal behavior. Normal judgment and thought content.   PELVIC: Deferred  Labs and Imaging No results found for this or any previous visit (from the past week). No results found.  Assessment and Plan:  Terren was seen today for follow-up.  Diagnoses and all orders for this visit:  Infertility counseling - Reviewed three main factors for fertility - ovulation, normal sperm and tubal patency - Ovulation: Check ovarian reserve with day 3 labs.  - Sperm: Slip given for semen analysis - Tubal Patency: If all other testing is normal, we can check tubal patency via HSG. Reviewed what the procedure entails. - Reviewed factors that most behaviors do and don't contribute to infertility - Encouraged continued TTC during this time. Reviewed conception and timing of ovulation and timing of trying - Information given for fertility specialists in the interim -     Anti mullerian hormone -     Ambulatory Referral For Surgery Scheduling -     Ambulatory referral to Infertility -     FSH -     Estradiol   Genital herpes simplex, unspecified site -     valACYclovir  (VALTREX ) 1000 MG tablet; Take 1 tablet (1,000 mg total) by mouth daily.  Anxiety -     sertraline  (ZOLOFT ) 25 MG tablet; Take 1 tablet (25 mg total) by mouth daily. -     sertraline  (ZOLOFT ) 50 MG tablet; Take 1 tablet (50 mg total) by mouth daily.  Current moderate episode of major depressive disorder without prior episode (HCC) -  sertraline  (ZOLOFT ) 25 MG tablet; Take 1 tablet (25 mg total) by mouth daily. -     sertraline  (ZOLOFT ) 50 MG tablet; Take 1 tablet (50 mg total) by mouth daily.   Meds ordered this encounter  Medications   valACYclovir  (VALTREX ) 1000 MG tablet    Sig: Take 1 tablet (1,000 mg total) by mouth daily.    Dispense:  90 tablet    Refill:  1   sertraline  (ZOLOFT ) 25 MG tablet    Sig: Take 1 tablet (25 mg total) by mouth daily.    Dispense:  90 tablet    Refill:  1   sertraline  (ZOLOFT ) 50 MG tablet     Sig: Take 1 tablet (50 mg total) by mouth daily.    Dispense:  90 tablet    Refill:  1     Routine preventative health maintenance measures emphasized. Please refer to After Visit Summary for other counseling recommendations.   No follow-ups on file.  Vina Solian, MD, FACOG Obstetrician & Gynecologist, [facility] for [facility], [facility]

## 2024-06-05 ENCOUNTER — Ambulatory Visit: Payer: Self-pay

## 2024-06-05 NOTE — Telephone Encounter (Signed)
 FYI Only or Action Required?: FYI only for provider.  Patient was last seen in primary care on 02/01/2024 by Alvan Dorothyann BIRCH, MD.  Called Nurse Triage reporting Triage.  Symptoms began about a month ago.  Interventions attempted: OTC medications: OTC iron  supplementation.  Symptoms are: gradually worsening.  Triage Disposition: See Physician Within 24 Hours  Patient/caregiver understands and will follow disposition?: Yes Reason for Disposition  [1] MODERATE weakness (e.g., interferes with work, school, normal activities) AND [2] persists > 3 days  Answer Assessment - Initial Assessment Questions Patient would like iron  levels checked. Has been feeling fatigued and states she has a hx of anemia that required infusions.  1. DESCRIPTION: Describe how you are feeling.     Fatigue  2. ONSET: When did these symptoms begin? (e.g., hours, days, weeks, months)     1 month  3. CAUSE: What do you think is causing the weakness or fatigue? (e.g., not drinking enough fluids, medical problem, trouble sleeping)     Anemia, had infusions in the past  4. PREGNANCY: Is there any chance you are pregnant? When was your last menstrual period?     Denies  Protocols used: Weakness (Generalized) and Fatigue-A-AH Copied from CRM [id number]. Topic: Clinical - Red Word Triage >> Jun 05, 2024  5:01 PM Mercer PEDLAR wrote: Red Word that prompted transfer to Nurse Triage: Lightheadedness and fatigue.

## 2024-06-06 ENCOUNTER — Encounter: Payer: Self-pay | Admitting: Urgent Care

## 2024-06-06 ENCOUNTER — Ambulatory Visit (INDEPENDENT_AMBULATORY_CARE_PROVIDER_SITE_OTHER): Admitting: Urgent Care

## 2024-06-06 VITALS — BP 103/67 | HR 63 | Resp 17 | Ht 65.0 in | Wt 203.2 lb

## 2024-06-06 DIAGNOSIS — R7309 Other abnormal glucose: Secondary | ICD-10-CM | POA: Diagnosis not present

## 2024-06-06 DIAGNOSIS — R5383 Other fatigue: Secondary | ICD-10-CM

## 2024-06-06 DIAGNOSIS — D509 Iron deficiency anemia, unspecified: Secondary | ICD-10-CM | POA: Diagnosis not present

## 2024-06-06 DIAGNOSIS — F5089 Other specified eating disorder: Secondary | ICD-10-CM

## 2024-06-06 DIAGNOSIS — E559 Vitamin D deficiency, unspecified: Secondary | ICD-10-CM | POA: Diagnosis not present

## 2024-06-06 NOTE — Patient Instructions (Signed)
 We drew your labs today. Please send me a copy of the iron  supplement you are taking over the counter.  Schedule your annual physical in May 2026, return sooner as needed.

## 2024-06-06 NOTE — Progress Notes (Signed)
 Established Patient Office Visit  Subjective:  Patient ID: Rebekah Carter, female    DOB: [birth year]  Age: 28 y.o. MRN: [id number]  Chief Complaint  Patient presents with   Anemia    Pt would like to have her iron  checked.   Transitions Of Care    HPI   Discussed the use of AI scribe software for clinical note transcription with the patient, who gave verbal consent to proceed.  History of Present Illness   Rebekah Carter is a 28 year old female with iron  deficiency anemia who presents for follow-up regarding her anemia management.  She last saw hematology in November of last year and received an iron  infusion but has not had her iron  levels checked since then. She experiences increased fatigue, especially during her menstrual cycle, along with tingling sensations and increased sleep. She also craves ice.  Her menstrual cycles last about five days, with the heaviest flow on the second day. She does not use Lysteda  or tranexamic acid  during her periods, despite it being prescribed in June of last year. Instead, she takes over-the-counter iron  supplements daily, estimating the dosage to be around 20-30 mg. No abdominal symptoms such as nausea, vomiting, or constipation from the iron  supplements.  Her current medications include albuterol  for asthma, azelastine , Zoloft  at a combined dose of 75 mg, and daily Valacyclovir . She does not take any other over-the-counter medications besides the iron  supplement.  In her family history, her father has high blood sugar and high cholesterol, but he is not diabetic. She has been consuming a lot of sugar lately and is aware of past instances of high glucose levels in her labs, although she was not fasting at the time. She was previously told she was vitamin D  deficient and has been taking a women's vitamin that includes vitamin D , but she has never been informed of a B12 deficiency.      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) 11/29/2022   Current moderate episode of major depressive disorder without prior episode (HCC) 09/16/2022   Anxiety 05/05/2022   Moderate asthma with acute exacerbation 12/22/2021   Herpes, genital 02/22/2021   Iron  deficiency anemia 09/29/2020   Refusal of blood transfusions as patient is Jehovah's Witness 12/17/2019   Spinal stenosis, lumbar region, 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   Social anxiety disorder 03/06/2015   Fatigue 01/26/2015   Leg length discrepancy 08/01/2014   Lumbar disc disease 02/12/2014   Hypopigmentation 02/12/2014   Latex allergy, contact dermatitis 11/20/2013   MENORRHAGIA 03/27/2009   DERMATITIS, ATOPIC 07/25/2008   Seizure (HCC) 11/27/2007   Past Medical History:  Diagnosis Date   Anemia    Anxiety    Asthma    as a child    Refusal of blood transfusions as patient is Jehovah's Witness 12/17/2019   Seizures (HCC)    last seizure 8 years ago    Past Surgical History:  Procedure Laterality Date   LUMBAR LAMINECTOMY/DECOMPRESSION MICRODISCECTOMY Right 12/02/2015   Procedure: CENTRAL DECOMPRESSION L5 - S1 AND L4 - L5 FOR SPINAL STENOSIS, MICRODISCECTOMY L4-L5, L5-S1, FORAMINOTOMIES L5 ADN S1 ROOT ON THE RIGHT;  Surgeon: Tanda Heading, MD;  Location: [facility];  Service: Orthopedics;  Laterality: Right;   TONSILLECTOMY AND ADENOIDECTOMY  2004   Social  History   Tobacco Use   Smoking status: Never   Smokeless tobacco: Never  Vaping Use   Vaping status: Former  Substance Use Topics   Alcohol use: No   Drug use: No      ROS: as noted in HPI  Objective:     BP 103/67   Pulse 63   Resp 17   Ht 5' 5 (1.651 m)   Wt 203 lb 4 oz (92.2 kg)   LMP 05/02/2024 (Exact Date)   SpO2 98%   BMI  33.82 kg/m  BP Readings from Last 3 Encounters:  06/06/24 103/67  05/22/24 107/69  02/08/24 121/75   Wt Readings from Last 3 Encounters:  06/06/24 203 lb 4 oz (92.2 kg)  05/22/24 201 lb (91.2 kg)  02/08/24 202 lb (91.6 kg)      Physical Exam Vitals and nursing note reviewed.  Constitutional:      General: She is not in acute distress.    Appearance: Normal appearance. She is not ill-appearing, toxic-appearing or diaphoretic.  HENT:     Head: Normocephalic and atraumatic.     Right Ear: External ear normal.     Left Ear: External ear normal.     Nose: Nose normal. No congestion or rhinorrhea.     Mouth/Throat:     Mouth: Mucous membranes are moist.     Pharynx: Oropharynx is clear. No oropharyngeal exudate or posterior oropharyngeal erythema.  Eyes:     General: No scleral icterus.       Right eye: No discharge.        Left eye: No discharge.     Extraocular Movements: Extraocular movements intact.     Pupils: Pupils are equal, round, and reactive to light.     Comments: Mild conjunctival pallor  Cardiovascular:     Rate and Rhythm: Normal rate and regular rhythm.     Heart sounds: Normal heart sounds. No murmur heard.    No friction rub. No gallop.  Pulmonary:     Effort: Pulmonary effort is normal. No respiratory distress.     Breath sounds: Normal breath sounds. No stridor. No wheezing or rhonchi.  Musculoskeletal:     Cervical back: Normal range of motion and neck supple. No rigidity or tenderness.  Lymphadenopathy:     Cervical: No cervical adenopathy.  Skin:    General: Skin is warm and dry.     Coloration: Skin is not jaundiced.     Findings: No bruising, erythema or rash.  Neurological:     General: No focal deficit present.     Mental Status: She is alert and oriented to person, place, and time.     Gait: Gait normal.  Psychiatric:        Mood and Affect: Mood normal.        Behavior: Behavior normal.      No results found for any visits on  06/06/24.  Last CBC Lab Results  Component Value Date   WBC 8.5 02/15/2023   HGB 11.2 (L) 02/15/2023   HCT 36.2 02/15/2023   MCV 81.2 02/15/2023   MCH 25.1 (L) 02/15/2023   RDW 13.9 02/15/2023   PLT 295 02/15/2023   Last metabolic panel Lab Results  Component Value Date   GLUCOSE 91 05/05/2022   NA 137 05/05/2022   K 4.0 05/05/2022   CL 104 05/05/2022   CO2 27 05/05/2022   BUN 11 05/05/2022   CREATININE 0.62 05/05/2022   GFR 121.78 05/05/2022  CALCIUM 9.3 05/05/2022   PROT 7.4 05/05/2022   ALBUMIN 4.4 05/05/2022   BILITOT 0.4 05/05/2022   ALKPHOS 75 05/05/2022   AST 16 05/05/2022   ALT 17 05/05/2022   ANIONGAP 7 09/28/2020   Last lipids Lab Results  Component Value Date   CHOL 137 12/17/2019   HDL 37.10 (L) 12/17/2019   LDLCALC 83 12/17/2019   TRIG 81.0 12/17/2019   CHOLHDL 4 12/17/2019   Last hemoglobin A1c Lab Results  Component Value Date   HGBA1C 5.4 05/05/2022   Last thyroid  functions Lab Results  Component Value Date   TSH 2.70 11/23/2021   Last vitamin D  Lab Results  Component Value Date   VD25OH 22 (L) 02/15/2023   Last vitamin B12 and Folate No results found for: VITAMINB12, FOLATE    The ASCVD Risk score (Arnett DK, et al., 2019) failed to calculate for the following reasons:   The 2019 ASCVD risk score is only valid for ages 44 to 81  Assessment & Plan:  Iron  deficiency anemia, unspecified iron  deficiency anemia type -     Iron , TIBC and Ferritin Panel -     CBC with Differential/Platelet -     TSH -     Comprehensive metabolic panel with GFR  Other fatigue -     Iron , TIBC and Ferritin Panel -     CBC with Differential/Platelet -     TSH -     Comprehensive metabolic panel with GFR -     Hemoglobin A1c -     B12 and Folate Panel  Abnormal glucose -     Hemoglobin A1c  Vitamin D  deficiency -     VITAMIN D  25 Hydroxy (Vit-D Deficiency, Fractures)  Pica  Assessment and Plan    Iron  deficiency anemia Iron   deficiency anemia with fatigue, tingling, and pica. Last hematology follow-up in November. Current iron  supplement details unknown. Regular menstrual cycles with moderate flow. - Request upload of iron  supplement screenshot to MyChart. - Order iron  studies. - Consider increasing iron  supplementation based on results.  Vitamin D  deficiency Vitamin D  deficiency managed with multivitamin containing vitamin D . - Order vitamin D  level.  Abnormal glucose Occasional elevated glucose levels. Family history of high cholesterol and elevated blood sugar. Increased sugar intake reported. - Order A1c.  Fatigue Fatigue possibly related to iron  deficiency anemia, vitamin D  deficiency, or other nutritional deficiencies. Thyroid  and blood sugar levels to be evaluated. - Order thyroid  function tests. - Order blood sugar tests.         Return in about 9 months (around 03/03/2025) for Annual Physical.   Benton LITTIE Gave, PA

## 2024-06-06 NOTE — Telephone Encounter (Signed)
 Pt seen in office today. Rebekah Mcshan  Carter

## 2024-06-07 ENCOUNTER — Ambulatory Visit: Payer: Self-pay | Admitting: Obstetrics and Gynecology

## 2024-06-07 ENCOUNTER — Ambulatory Visit: Payer: Self-pay | Admitting: Urgent Care

## 2024-06-07 DIAGNOSIS — D500 Iron deficiency anemia secondary to blood loss (chronic): Secondary | ICD-10-CM

## 2024-06-07 LAB — CBC WITH DIFFERENTIAL/PLATELET
Basophils Absolute: 0 x10E3/uL (ref 0.0–0.2)
Basos: 0 %
EOS (ABSOLUTE): 0.5 x10E3/uL — ABNORMAL HIGH (ref 0.0–0.4)
Eos: 6 %
Hematocrit: 32.9 % — ABNORMAL LOW (ref 34.0–46.6)
Hemoglobin: 9.8 g/dL — ABNORMAL LOW (ref 11.1–15.9)
Immature Grans (Abs): 0 x10E3/uL (ref 0.0–0.1)
Immature Granulocytes: 0 %
Lymphocytes Absolute: 3 x10E3/uL (ref 0.7–3.1)
Lymphs: 33 %
MCH: 23.1 pg — ABNORMAL LOW (ref 26.6–33.0)
MCHC: 29.8 g/dL — ABNORMAL LOW (ref 31.5–35.7)
MCV: 78 fL — ABNORMAL LOW (ref 79–97)
Monocytes Absolute: 0.7 x10E3/uL (ref 0.1–0.9)
Monocytes: 8 %
Neutrophils Absolute: 4.8 x10E3/uL (ref 1.4–7.0)
Neutrophils: 53 %
Platelets: 300 x10E3/uL (ref 150–450)
RBC: 4.24 x10E6/uL (ref 3.77–5.28)
RDW: 15.8 % — ABNORMAL HIGH (ref 11.7–15.4)
WBC: 9 x10E3/uL (ref 3.4–10.8)

## 2024-06-07 LAB — FOLLICLE STIMULATING HORMONE: FSH: 8.8 m[IU]/mL

## 2024-06-07 LAB — TSH: TSH: 2.36 u[IU]/mL (ref 0.450–4.500)

## 2024-06-07 LAB — COMPREHENSIVE METABOLIC PANEL WITH GFR
ALT: 14 IU/L (ref 0–32)
AST: 18 IU/L (ref 0–40)
Albumin: 4.2 g/dL (ref 4.0–5.0)
Alkaline Phosphatase: 99 IU/L (ref 44–121)
BUN/Creatinine Ratio: 22 (ref 9–23)
BUN: 14 mg/dL (ref 6–20)
Bilirubin Total: <0.2 mg/dL (ref 0.0–1.2)
CO2: 19 mmol/L — ABNORMAL LOW (ref 20–29)
Calcium: 9.2 mg/dL (ref 8.7–10.2)
Chloride: 105 mmol/L (ref 96–106)
Creatinine, Ser: 0.63 mg/dL (ref 0.57–1.00)
Globulin, Total: 2.5 g/dL (ref 1.5–4.5)
Glucose: 96 mg/dL (ref 70–99)
Potassium: 4.5 mmol/L (ref 3.5–5.2)
Sodium: 138 mmol/L (ref 134–144)
Total Protein: 6.7 g/dL (ref 6.0–8.5)
eGFR: 123 mL/min/1.73 (ref >59)

## 2024-06-07 LAB — HEMOGLOBIN A1C
Est. average glucose Bld gHb Est-mCnc: 108 mg/dL
Hgb A1c MFr Bld: 5.4 % (ref 4.8–5.6)

## 2024-06-07 LAB — IRON,TIBC AND FERRITIN PANEL
Ferritin: 10 ng/mL — ABNORMAL LOW (ref 15–150)
Iron Saturation: 4 % — CL (ref 15–55)
Iron: 15 ug/dL — ABNORMAL LOW (ref 27–159)
Total Iron Binding Capacity: 417 ug/dL (ref 250–450)
UIBC: 402 ug/dL (ref 131–425)

## 2024-06-07 LAB — B12 AND FOLATE PANEL
Folate: 12.5 ng/mL (ref >3.0)
Vitamin B-12: 599 pg/mL (ref 232–1245)

## 2024-06-07 LAB — ESTRADIOL: Estradiol: 18.5 pg/mL

## 2024-06-07 LAB — VITAMIN D 25 HYDROXY (VIT D DEFICIENCY, FRACTURES): Vit D, 25-Hydroxy: 26.6 ng/mL — ABNORMAL LOW (ref 30.0–100.0)

## 2024-06-07 LAB — ANTI MULLERIAN HORMONE: ANTI-MULLERIAN HORMONE (AMH): 2.45 ng/mL

## 2024-06-07 MED ORDER — FERROUS FUMARATE 324 (106 FE) MG PO TABS: 1.0000 | ORAL_TABLET | Freq: Every day | ORAL | 1 refills | Status: AC

## 2024-08-07 ENCOUNTER — Ambulatory Visit: Admitting: Urgent Care

## 2024-08-08 ENCOUNTER — Ambulatory Visit: Admitting: Urgent Care

## 2024-08-16 ENCOUNTER — Ambulatory Visit: Payer: Self-pay | Admitting: Urgent Care

## 2024-08-16 ENCOUNTER — Ambulatory Visit (INDEPENDENT_AMBULATORY_CARE_PROVIDER_SITE_OTHER): Admitting: Urgent Care

## 2024-08-16 ENCOUNTER — Ambulatory Visit

## 2024-08-16 VITALS — BP 107/71 | HR 86 | Ht 65.0 in | Wt 205.0 lb

## 2024-08-16 DIAGNOSIS — N926 Irregular menstruation, unspecified: Secondary | ICD-10-CM

## 2024-08-16 DIAGNOSIS — N920 Excessive and frequent menstruation with regular cycle: Secondary | ICD-10-CM

## 2024-08-16 NOTE — Progress Notes (Signed)
 Established Patient Office Visit  Subjective:  Patient ID: Rebekah Carter, female    DOB: 1996/04/30  Age: 28 y.o. MRN: [id number]  Chief Complaint  Patient presents with   Menorrhagia    10-12 days late with negative pregnancy test; extra heavy period x 7 days     HPI  Discussed the use of AI scribe software for clinical note transcription with the patient, who gave verbal consent to proceed.  History of Present Illness   Rebekah Carter is a 28 year old female who presents with menstrual irregularities and heavy bleeding.  She has experienced irregularities in her menstrual cycle, which has been consistently regular since her early twenties. Her cycle was delayed by 10 to 12 days, with brown discharge for about a week, followed by light pink discharge with streaks of fresh blood. A full menstrual flow began on October 25th and has continued heavily for seven days.  The current menstrual flow is heavier than usual, typically heavy for the first two days followed by lighter days. This cycle has been consistently heavy, requiring the use of a full box of 36 pads within a week. Despite the heavy bleeding, there is no pain, cramps, or stomach pain, although she feels tired. She has not needed to take any pain medication such as Tylenol .  She has a history of low iron , managed with iron  supplements. She has not experienced similar menstrual irregularities in the past and has never undergone a pelvic ultrasound. Larger clots than usual occur sporadically during this cycle.  No pelvic pain, pressure, or bloating. A pregnancy test was negative.       Patient Active Problem List   Diagnosis Date Noted   Non-seasonal allergic rhinitis 10/20/2023   Screening examination for STD (sexually transmitted disease) 05/05/2023   Atypical squamous cell changes of undetermined significance (ASCUS) on cervical cytology with positive high risk human papilloma virus (HPV) 11/29/2022   Current  moderate episode of major depressive disorder without prior episode (HCC) 09/16/2022   Anxiety 05/05/2022   Moderate asthma with acute exacerbation 12/22/2021   Herpes, genital 02/22/2021   Iron  deficiency anemia 09/29/2020   Refusal of blood transfusions as patient is Jehovah's Witness 12/17/2019   Spinal stenosis, lumbar region, 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   Social anxiety disorder 03/06/2015   Fatigue 01/26/2015   Leg length discrepancy 08/01/2014   Lumbar disc disease 02/12/2014   Hypopigmentation 02/12/2014   Latex allergy, contact dermatitis 11/20/2013   MENORRHAGIA 03/27/2009   DERMATITIS, ATOPIC 07/25/2008   Seizure (HCC) 11/27/2007   Past Medical History:  Diagnosis Date   Anemia    Anxiety    Asthma    as a child    Refusal of blood transfusions as patient is Jehovah's Witness 12/17/2019   Seizures (HCC)    last seizure 8 years ago    Past Surgical History:  Procedure Laterality Date   LUMBAR LAMINECTOMY/DECOMPRESSION MICRODISCECTOMY Right 12/02/2015   Procedure: CENTRAL DECOMPRESSION L5 - S1 AND L4 - L5 FOR SPINAL STENOSIS, MICRODISCECTOMY L4-L5, L5-S1, FORAMINOTOMIES L5 ADN S1 ROOT ON THE RIGHT;  Surgeon: Tanda Heading, MD;  Location: [facility];  Service: Orthopedics;  Laterality: Right;   TONSILLECTOMY AND ADENOIDECTOMY  2004   Social History   Tobacco Use   Smoking status: Never   Smokeless tobacco: Never  Vaping Use   Vaping status: Former  Substance Use Topics   Alcohol use: No   Drug use: No      ROS: as noted in HPI  Objective:     BP 107/71   Pulse 86   Ht 5' 5 (1.651 m)   Wt 205 lb (93 kg)   SpO2 98%   BMI 34.11 kg/m  BP Readings from Last 3 Encounters:  08/16/24 107/71  06/06/24 103/67  05/22/24 107/69   Wt Readings from Last 3 Encounters:  08/16/24 205 lb (93 kg)  06/06/24 203 lb 4 oz (92.2 kg)  05/22/24 201 lb (91.2 kg)      Physical Exam Vitals and nursing note reviewed.  Constitutional:      General: She is not  in acute distress.    Appearance: Normal appearance. She is not ill-appearing, toxic-appearing or diaphoretic.  HENT:     Head: Normocephalic and atraumatic.  Eyes:     General: No scleral icterus.       Right eye: No discharge.        Left eye: No discharge.     Extraocular Movements: Extraocular movements intact.     Pupils: Pupils are equal, round, and reactive to light.  Cardiovascular:     Rate and Rhythm: Normal rate.  Pulmonary:     Effort: Pulmonary effort is normal. No respiratory distress.  Skin:    General: Skin is warm and dry.     Coloration: Skin is not jaundiced.     Findings: No bruising, erythema or rash.  Neurological:     General: No focal deficit present.     Mental Status: She is alert and oriented to person, place, and time.     Gait: Gait normal.  Psychiatric:        Mood and Affect: Mood normal.        Behavior: Behavior normal.      The ASCVD Risk score (Arnett DK, et al., 2019) failed to calculate for the following reasons:   The 2019 ASCVD risk score is only valid for ages 50 to 1  Assessment & Plan:  Abnormal menses -     CBC with Differential/Platelet -     hCG, quantitative, pregnancy; Future -     Iron , TIBC and Ferritin Panel -     TSH + free T4 -     PT and PTT -     CMP14+EGFR -     Prolactin -     US  PELVIC COMPLETE WITH TRANSVAGINAL; Future   Assessment and Plan    Irregular and heavy menstruation Irregular and heavy menstruation with delayed periods and heavy bleeding. Differential includes fibroids, common in women aged 74-50, estrogen-derived, and can cause abnormal bleeding. - Ordered pelvic ultrasound to screen for fibroids. - Ordered blood work including platelet count, iron  levels, and thyroid  function tests.  Iron  deficiency Low iron  levels, heavy menstrual bleeding may exacerbate deficiency. - Continue iron  supplementation.        No follow-ups on file.   Benton LITTIE Gave, PA

## 2024-08-17 ENCOUNTER — Encounter: Payer: Self-pay | Admitting: Urgent Care

## 2024-08-17 LAB — CBC WITH DIFFERENTIAL/PLATELET
Basophils Absolute: 0.1 x10E3/uL (ref 0.0–0.2)
Basos: 1 %
EOS (ABSOLUTE): 0.3 x10E3/uL (ref 0.0–0.4)
Eos: 2 %
Hematocrit: 31.1 % — ABNORMAL LOW (ref 34.0–46.6)
Hemoglobin: 9.2 g/dL — ABNORMAL LOW (ref 11.1–15.9)
Immature Grans (Abs): 0.1 x10E3/uL (ref 0.0–0.1)
Immature Granulocytes: 1 %
Lymphocytes Absolute: 3 x10E3/uL (ref 0.7–3.1)
Lymphs: 26 %
MCH: 22.4 pg — ABNORMAL LOW (ref 26.6–33.0)
MCHC: 29.6 g/dL — ABNORMAL LOW (ref 31.5–35.7)
MCV: 76 fL — ABNORMAL LOW (ref 79–97)
Monocytes Absolute: 0.6 x10E3/uL (ref 0.1–0.9)
Monocytes: 5 %
Neutrophils Absolute: 7.8 x10E3/uL — ABNORMAL HIGH (ref 1.4–7.0)
Neutrophils: 65 %
Platelets: 350 x10E3/uL (ref 150–450)
RBC: 4.11 x10E6/uL (ref 3.77–5.28)
RDW: 15.7 % — ABNORMAL HIGH (ref 11.7–15.4)
WBC: 11.9 x10E3/uL — ABNORMAL HIGH (ref 3.4–10.8)

## 2024-08-17 LAB — CMP14+EGFR
ALT: 14 IU/L (ref 0–32)
AST: 16 IU/L (ref 0–40)
Albumin: 4.6 g/dL (ref 4.0–5.0)
Alkaline Phosphatase: 99 IU/L (ref 41–116)
BUN/Creatinine Ratio: 23 (ref 9–23)
BUN: 15 mg/dL (ref 6–20)
Bilirubin Total: 0.2 mg/dL (ref 0.0–1.2)
CO2: 21 mmol/L (ref 20–29)
Calcium: 9.4 mg/dL (ref 8.7–10.2)
Chloride: 99 mmol/L (ref 96–106)
Creatinine, Ser: 0.66 mg/dL (ref 0.57–1.00)
Globulin, Total: 2.9 g/dL (ref 1.5–4.5)
Glucose: 137 mg/dL — ABNORMAL HIGH (ref 70–99)
Potassium: 4.1 mmol/L (ref 3.5–5.2)
Sodium: 135 mmol/L (ref 134–144)
Total Protein: 7.5 g/dL (ref 6.0–8.5)
eGFR: 121 mL/min/1.73 (ref >59)

## 2024-08-17 LAB — PT AND PTT
INR: 0.9 (ref 0.9–1.2)
Prothrombin Time: 10.3 s (ref 9.1–12.0)
aPTT: 25 s (ref 24–33)

## 2024-08-17 LAB — IRON,TIBC AND FERRITIN PANEL
Ferritin: 8 ng/mL — ABNORMAL LOW (ref 15–150)
Iron Saturation: 5 % — CL (ref 15–55)
Iron: 24 ug/dL — ABNORMAL LOW (ref 27–159)
Total Iron Binding Capacity: 500 ug/dL — ABNORMAL HIGH (ref 250–450)
UIBC: 476 ug/dL — ABNORMAL HIGH (ref 131–425)

## 2024-08-17 LAB — PROLACTIN: Prolactin: 17.4 ng/mL (ref 4.8–33.4)

## 2024-08-17 LAB — TSH+FREE T4
Free T4: 1.17 ng/dL (ref 0.82–1.77)
TSH: 0.998 u[IU]/mL (ref 0.450–4.500)

## 2024-08-19 MED ORDER — NORETHINDRONE ACETATE 5 MG PO TABS: 5.0000 mg | ORAL_TABLET | Freq: Every day | ORAL | 0 refills | Status: DC

## 2024-08-21 ENCOUNTER — Ambulatory Visit: Admitting: Obstetrics and Gynecology

## 2024-08-21 ENCOUNTER — Encounter: Payer: Self-pay | Admitting: Obstetrics and Gynecology

## 2024-08-21 VITALS — BP 120/78 | HR 84 | Wt 206.1 lb

## 2024-08-21 DIAGNOSIS — N920 Excessive and frequent menstruation with regular cycle: Secondary | ICD-10-CM | POA: Diagnosis not present

## 2024-08-21 DIAGNOSIS — N83201 Unspecified ovarian cyst, right side: Secondary | ICD-10-CM

## 2024-08-21 DIAGNOSIS — N83202 Unspecified ovarian cyst, left side: Secondary | ICD-10-CM | POA: Diagnosis not present

## 2024-08-21 MED ORDER — NORETHINDRONE ACETATE 5 MG PO TABS: 5.0000 mg | ORAL_TABLET | Freq: Every day | ORAL | 0 refills | Status: AC

## 2024-08-21 NOTE — Progress Notes (Signed)
 GYNECOLOGY OFFICE VISIT NOTE  History:  Rebekah Carter is a 28 y.o. G0P0000 here today for AUB.   Discussed the use of AI scribe software for clinical note transcription with the patient, who gave verbal consent to proceed.  History of Present Illness Rebekah Carter is a 28 year old female who presents with prolonged menstrual bleeding.  She experienced an unexpected delay in her menstrual cycle, with her period expected on October 17th but not arriving until November 1st. She had some bleeding that started on 10/17 and she characterized by light spotting and brown discharge, which she attributes to 'old blood'. This continued until her actual period began.   A pregnancy test taken after a week of delay was negative. Despite this, her period began on her birthday and has continued for 12 days, which is unusual for her. She has never experienced such prolonged bleeding before and is not experiencing any pain or cramps.  She feels 'sluggish' and 'worn down', likely due to the heavy bleeding, which has exacerbated her anemia. She has been using a significant number of pads, more than usual, and finds the bleeding disruptive to her daily life, including work.  Her past medical history includes anemia, for which she has started taking iron  supplements. She is not on any other medications except for vitamins and iron . She has no history of irregular periods and has not been on birth control.  An ultrasound was performed; the patient recalls being told that her uterine lining was thickened and that there may be cysts.  I independently reviewed her recent labwork by her PCP and it is noted below. I reviewed the note from [facility] on  08/16/24.  I reviewed the report of her recent pelvic US  as noted below.   I reviewed the images from her US  myself and note the following: - Right ovarian cyst about 3.5x4.5 cm. Appearance on TV looks consistent with hemorrhagic cyst vs endometrioma.  Favors endometrioma. Appearance on TA looks less like endometrioma. Also appears to have adjacent cyst to the ovary in this view, possible paratubal cyst as this cyst is hypoechoic. Uterus is normal in appearance and endometrium is thickened at 15 mm. Cervix is normal in appearance. Left ovary is overall unremarkable but has a 2.5 cm cyst that is similar in appearance to the right ovary albeit much smaller.   Past Medical History:  Diagnosis Date   Anemia    Anxiety    Asthma    as a child    Refusal of blood transfusions as patient is Jehovah's Witness 12/17/2019   Seizures (HCC)    last seizure 8 years ago     Past Surgical History:  Procedure Laterality Date   LUMBAR LAMINECTOMY/DECOMPRESSION MICRODISCECTOMY Right 12/02/2015   Procedure: CENTRAL DECOMPRESSION L5 - S1 AND L4 - L5 FOR SPINAL STENOSIS, MICRODISCECTOMY L4-L5, L5-S1, FORAMINOTOMIES L5 ADN S1 ROOT ON THE RIGHT;  Surgeon: Tanda Heading, MD;  Location: [facility];  Service: Orthopedics;  Laterality: Right;   TONSILLECTOMY AND ADENOIDECTOMY  2004    The following portions of the patient's history were reviewed and updated as appropriate: allergies, current medications, past family history, past medical history, past social history, past surgical history and problem list.   Health Maintenance:   Diagnosis  Date Value Ref Range Status  02/08/2024 (A)  Final   - Atypical squamous cells of undetermined significance (ASC-US )   HPV neg Review of Systems:  Pertinent items noted in HPI and remainder of comprehensive ROS  otherwise negative.  Physical Exam:  BP 120/78   Pulse 84   Wt 206 lb 1.9 oz (93.5 kg)   LMP 08/10/2024   BMI 34.30 kg/m  CONSTITUTIONAL: Well-developed, well-nourished female in no acute distress.  HEENT:  Normocephalic, atraumatic. External right and left ear normal. No scleral icterus.  NECK: Normal range of motion, supple, no masses noted on observation SKIN: No rash noted. Not diaphoretic. No erythema. No  pallor. MUSCULOSKELETAL: Normal range of motion. No edema noted. NEUROLOGIC: Alert and oriented to person, place, and time. Normal muscle tone coordination. No cranial nerve deficit noted. PSYCHIATRIC: Normal mood and affect. Normal behavior. Normal judgment and thought content.  PELVIC: Deferred  Labs and Imaging Results for orders placed or performed in visit on 08/16/24 (from the past week)  CBC w/Diff/Platelet   Collection Time: 08/16/24  2:49 PM  Result Value Ref Range   WBC 11.9 (H) 3.4 - 10.8 x10E3/uL   RBC 4.11 3.77 - 5.28 x10E6/uL   Hemoglobin 9.2 (L) 11.1 - 15.9 g/dL   Hematocrit 68.8 (L) 65.9 - 46.6 %   MCV 76 (L) 79 - 97 fL   MCH 22.4 (L) 26.6 - 33.0 pg   MCHC 29.6 (L) 31.5 - 35.7 g/dL   RDW 84.2 (H) 88.2 - 84.5 %   Platelets 350 150 - 450 x10E3/uL   Neutrophils 65 Not Estab. %   Lymphs 26 Not Estab. %   Monocytes 5 Not Estab. %   Eos 2 Not Estab. %   Basos 1 Not Estab. %   Neutrophils Absolute 7.8 (H) 1.4 - 7.0 x10E3/uL   Lymphocytes Absolute 3.0 0.7 - 3.1 x10E3/uL   Monocytes Absolute 0.6 0.1 - 0.9 x10E3/uL   EOS (ABSOLUTE) 0.3 0.0 - 0.4 x10E3/uL   Basophils Absolute 0.1 0.0 - 0.2 x10E3/uL   Immature Granulocytes 1 Not Estab. %   Immature Grans (Abs) 0.1 0.0 - 0.1 x10E3/uL  Iron , TIBC and Ferritin Panel   Collection Time: 08/16/24  2:49 PM  Result Value Ref Range   Total Iron  Binding Capacity 500 (H) 250 - 450 ug/dL   UIBC 523 (H) 868 - 574 ug/dL   Iron  24 (L) 27 - 159 ug/dL   Iron  Saturation 5 (LL) 15 - 55 %   Ferritin 8 (L) 15 - 150 ng/mL  TSH + free T4   Collection Time: 08/16/24  2:49 PM  Result Value Ref Range   TSH 0.998 0.450 - 4.500 uIU/mL   Free T4 1.17 0.82 - 1.77 ng/dL  PT and PTT   Collection Time: 08/16/24  2:49 PM  Result Value Ref Range   INR 0.9 0.9 - 1.2   Prothrombin Time 10.3 9.1 - 12.0 sec   aPTT 25 24 - 33 sec  CMP14+EGFR   Collection Time: 08/16/24  2:49 PM  Result Value Ref Range   Glucose 137 (H) 70 - 99 mg/dL   BUN 15 6 -  20 mg/dL   Creatinine, Ser 9.33 0.57 - 1.00 mg/dL   eGFR 878 >40 fO/fpw/8.26   BUN/Creatinine Ratio 23 9 - 23   Sodium 135 134 - 144 mmol/L   Potassium 4.1 3.5 - 5.2 mmol/L   Chloride 99 96 - 106 mmol/L   CO2 21 20 - 29 mmol/L   Calcium 9.4 8.7 - 10.2 mg/dL   Total Protein 7.5 6.0 - 8.5 g/dL   Albumin 4.6 4.0 - 5.0 g/dL   Globulin, Total 2.9 1.5 - 4.5 g/dL   Bilirubin Total 0.2  0.0 - 1.2 mg/dL   Alkaline Phosphatase 99 41 - 116 IU/L   AST 16 0 - 40 IU/L   ALT 14 0 - 32 IU/L  Prolactin   Collection Time: 08/16/24  2:49 PM  Result Value Ref Range   Prolactin 17.4 4.8 - 33.4 ng/mL   US  PELVIC COMPLETE WITH TRANSVAGINAL Result Date: 08/16/2024 EXAM: US  Pelvis, Complete Transvaginal and Transabdominal without Doppler TECHNIQUE: Transabdominal and transvaginal pelvic duplex ultrasound using B-mode/gray scaled imaging without Doppler spectral analysis and color flow was obtained. COMPARISON: None provided CLINICAL HISTORY: heavy clotting menses, abnormal late bleeding FINDINGS: UTERUS: Uterus measures 7.43 x 3.80 x 5.27 cm with a volume of 77.91 cm. Anteverted anteflexed uterus is normal in size and configuration with no uterine fibroids. Uterus demonstrates normal myometrial echotexture. ENDOMETRIAL STRIPE: Endometrium measures 15.27 mm. Mildly heterogeneous thickened endometrium without discrete endometrial mass, with scattered internal vascularity and color Doppler. RIGHT OVARY: Right ovary measures 5.48 x 3.64 x 4.56 cm with a volume of 47.63 cm. Two similar subjacent hypoechoic right ovarian masses with no internal vascularity on color Doppler, largest 3.4 x 1.8 x 3.4 cm. Separate simple 2.2 x 2.2 x 3.8 cm right ovarian cyst. LEFT OVARY: Left ovary measures 4.26 x 1.74 x 2.83 cm with a volume of 10.98 cm. Hypoechoic 2.4 x 1.3 x 1.9 cm left ovarian mass with no internal vascularity on color Doppler. FREE FLUID: No free fluid. IMPRESSION: 1. Mildly heterogeneous thickened (15 mm) endometrium  without a discrete mass, with internal vascularity present. Occult endometrial polyps not excluded. Suggest further evaluation with sonohysterography. 2. Two similar avascular hypoechoic right ovarian masses measuring up to 3.4 cm. Separate simple right ovarian cyst measuring 3.8 cm. Avascular hypoechoic left ovarian mass measuring 2.4 cm. Recommend ultrasound follow-up in 612 weeks. 3. No uterine fibroids. Electronically signed by: Selinda Blue MD 08/16/2024 04:41 PM EST RP Workstation: [id number]    Assessment and Plan:  1. MENORRHAGIA Single episode of AUB after delayed menstrual cycle. Unclear etiology. PCP checked all the labs I would check, so no additional testign recommending.  She is anemic, so I would also recommend continuing the Iron .  Discussed Aygestin x 2 weeks to reset her cycle and stop her bleeding. Prescription sent to her pharmacy.  - norethindrone (AYGESTIN) 5 MG tablet; Take 1 tablet (5 mg total) by mouth daily.  Dispense: 14 tablet; Refill: 0  2. Cysts of both ovaries (Primary) Will recheck US  in 12 weeks. Based on the report, I felt like additional follow up would be unlikely to be needed. I then reviewed the images and given also her history of infertility, message sent to ensure follow up after her US . I think this would be wise because if cysts are persistent, then I would consider surgery given her history of also infertility.  - US  PELVIC COMPLETE WITH TRANSVAGINAL; Future   Meds ordered this encounter  Medications   norethindrone (AYGESTIN) 5 MG tablet    Sig: Take 1 tablet (5 mg total) by mouth daily.    Dispense:  14 tablet    Refill:  0     Routine preventative health maintenance measures emphasized. Please refer to After Visit Summary for other counseling recommendations.   Return in about 3 months (around 11/21/2024) for Follow up.  Vina Solian, MD, FACOG Obstetrician & Gynecologist, [facility] for [facility], [facility]

## 2024-09-08 ENCOUNTER — Encounter: Payer: Self-pay | Admitting: Obstetrics and Gynecology

## 2024-09-12 ENCOUNTER — Ambulatory Visit: Admitting: Obstetrics and Gynecology

## 2024-10-07 ENCOUNTER — Other Ambulatory Visit: Payer: Self-pay | Admitting: Urgent Care

## 2024-10-07 ENCOUNTER — Encounter: Payer: Self-pay | Admitting: Urgent Care

## 2024-10-07 DIAGNOSIS — A6000 Herpesviral infection of urogenital system, unspecified: Secondary | ICD-10-CM

## 2024-10-07 DIAGNOSIS — F321 Major depressive disorder, single episode, moderate: Secondary | ICD-10-CM

## 2024-10-07 DIAGNOSIS — F419 Anxiety disorder, unspecified: Secondary | ICD-10-CM

## 2024-10-07 MED ORDER — VALACYCLOVIR HCL 1 G PO TABS: 1000.0000 mg | ORAL_TABLET | Freq: Every day | ORAL | 1 refills | Status: AC

## 2024-10-07 MED ORDER — ALBUTEROL SULFATE HFA 108 (90 BASE) MCG/ACT IN AERS: 2.0000 | INHALATION_SPRAY | Freq: Four times a day (QID) | RESPIRATORY_TRACT | 0 refills | Status: AC | PRN

## 2024-10-07 MED ORDER — SERTRALINE HCL 50 MG PO TABS: 50.0000 mg | ORAL_TABLET | Freq: Every day | ORAL | 1 refills | Status: AC

## 2024-10-07 MED ORDER — SERTRALINE HCL 25 MG PO TABS: 25.0000 mg | ORAL_TABLET | Freq: Every day | ORAL | 1 refills | Status: AC

## 2024-10-07 NOTE — Telephone Encounter (Signed)
 Already sent to covering provider.

## 2024-10-07 NOTE — Telephone Encounter (Signed)
 Copied from CRM [id number]. Topic: Clinical - Medication Refill >> Oct 07, 2024 10:54 AM Rosaria A wrote: Medication: sertraline  (ZOLOFT ) 25 MG tablet sertraline  (ZOLOFT ) 50 MG tablet valACYclovir  (VALTREX ) 1000 MG tablet albuterol  (VENTOLIN  HFA) 108 (90 Base) MCG/ACT inhaler   Has the patient contacted their pharmacy? No  This is the patient's preferred pharmacy:  [id number] - [place], [place] - [address] [place]  [address] Phone: [phone] Fax: [phone]  Is this the correct pharmacy for this prescription? Yes   Has the prescription been filled recently? No  Is the patient out of the medication? No  Has the patient been seen for an appointment in the last year OR does the patient have an upcoming appointment? Yes  Can we respond through MyChart? Yes  Agent: Please be advised that Rx refills may take up to 3 business days. We ask that you follow-up with your pharmacy.

## 2024-11-04 ENCOUNTER — Encounter: Payer: Self-pay | Admitting: Family

## 2024-11-11 ENCOUNTER — Other Ambulatory Visit

## 2024-11-18 ENCOUNTER — Other Ambulatory Visit

## 2025-03-04 ENCOUNTER — Encounter: Admitting: Urgent Care
